# Patient Record
Sex: Female | Born: 1965
Health system: Southern US, Community
[De-identification: ages and names within clinical notes are randomized; demographics above are authoritative.]

## PROBLEM LIST (undated history)

## (undated) DIAGNOSIS — F419 Anxiety disorder, unspecified: Secondary | ICD-10-CM

## (undated) DIAGNOSIS — M1990 Unspecified osteoarthritis, unspecified site: Secondary | ICD-10-CM

## (undated) DIAGNOSIS — N289 Disorder of kidney and ureter, unspecified: Secondary | ICD-10-CM

## (undated) DIAGNOSIS — F32A Depression, unspecified: Secondary | ICD-10-CM

## (undated) HISTORY — DX: Anxiety disorder, unspecified: F41.9

## (undated) HISTORY — PX: CHOLECYSTECTOMY: SHX55

## (undated) HISTORY — DX: Unspecified osteoarthritis, unspecified site: M19.90

## (undated) HISTORY — DX: Depression, unspecified: F32.A

## (undated) HISTORY — PX: JOINT REPLACEMENT: SHX530

## (undated) HISTORY — PX: BLADDER SURGERY: SHX569

---

## 1998-04-13 ENCOUNTER — Other Ambulatory Visit: Admission: RE | Admit: 1998-04-13 | Discharge: 1998-04-13 | Payer: Self-pay | Admitting: *Deleted

## 1998-10-18 ENCOUNTER — Other Ambulatory Visit: Admission: RE | Admit: 1998-10-18 | Discharge: 1998-10-18 | Payer: Self-pay | Admitting: Obstetrics and Gynecology

## 1999-04-24 ENCOUNTER — Other Ambulatory Visit: Admission: RE | Admit: 1999-04-24 | Discharge: 1999-04-24 | Payer: Self-pay | Admitting: *Deleted

## 2000-06-12 ENCOUNTER — Other Ambulatory Visit: Admission: RE | Admit: 2000-06-12 | Discharge: 2000-06-12 | Payer: Self-pay | Admitting: *Deleted

## 2001-06-12 ENCOUNTER — Other Ambulatory Visit: Admission: RE | Admit: 2001-06-12 | Discharge: 2001-06-12 | Payer: Self-pay | Admitting: Obstetrics and Gynecology

## 2002-09-01 ENCOUNTER — Other Ambulatory Visit: Admission: RE | Admit: 2002-09-01 | Discharge: 2002-09-01 | Payer: Self-pay | Admitting: Obstetrics and Gynecology

## 2003-12-16 ENCOUNTER — Other Ambulatory Visit: Admission: RE | Admit: 2003-12-16 | Discharge: 2003-12-16 | Payer: Self-pay | Admitting: Obstetrics and Gynecology

## 2006-09-08 ENCOUNTER — Inpatient Hospital Stay (HOSPITAL_COMMUNITY): Admission: EM | Admit: 2006-09-08 | Discharge: 2006-09-09 | Payer: Self-pay | Admitting: Emergency Medicine

## 2006-09-08 ENCOUNTER — Encounter (INDEPENDENT_AMBULATORY_CARE_PROVIDER_SITE_OTHER): Payer: Self-pay | Admitting: *Deleted

## 2009-12-18 ENCOUNTER — Encounter: Admission: RE | Admit: 2009-12-18 | Discharge: 2009-12-18 | Payer: Self-pay | Admitting: Gastroenterology

## 2011-04-19 NOTE — Op Note (Signed)
NAMEJHOANNA, Toni Hamilton            ACCOUNT NO.:  [id number]   MEDICAL RECORD NO.:  [id number]          PATIENT TYPE:  INP   LOCATION:  1503                         FACILITY:  [facility]   PHYSICIAN:  Lebron Conners, M.D.   DATE OF BIRTH:  03-16-1966   DATE OF PROCEDURE:  09/08/2006  DATE OF DISCHARGE:                                 OPERATIVE REPORT   PRE AND POSTOPERATIVE DIAGNOSIS:  Cholelithiasis and acute cholecystitis.   OPERATION:  Laparoscopic cholecystectomy.   SURGEON:  Lebron Conners, M.D.   ASSISTANT:  Currie Paris, M.D.   ANESTHESIA:  General and local   COMPLICATIONS:  None.   BLOOD LOSS:  Minimal.   SPECIMEN:  Gallbladder.   PROCEDURE:  After the patient was monitored and asleep and had routine  preparation and draping of the abdomen, I infiltrated local anesthetic just  below the umbilicus.  I made about a 2 cm transverse incision and did  deepened that through the subcutaneous tissues until I came to the midline  fascia which I incised for about 2 cm in the midline.  I then bluntly  entered the peritoneal cavity.  I placed a 0 Vicryl pursestring suture in  the fascia and secured a Hassan cannula and inflated the abdomen with carbon  dioxide.  I then viewed the abdominal contents and saw no abnormalities  except for acute inflammation in the right upper quadrant with very  edematous and enlarged gallbladder.  I put in three additional ports through  the anesthetized sites under direct view assuring that there was no injury  of the viscera.  I then decompressed the gallbladder with a suction  aspirator.  I grasped fundus with a grasper and elevated it toward the right  shoulder.  With the patient positioned head-up foot down and tilted toward  the left, I took down the adhesions and then grasped the infundibulum of the  gallbladder and pulled it laterally.  The whole area was very edematous but  I was able to identify the cystic duct emerging from the  infundibulum and  coursing medially toward the common bile duct.  I clipped it with three  clips cut between the two which were closest to the gallbladder.  I then  identified two branches of the cystic artery and similarly clipped and  divided them.  I found one or two other small similar arterial structures  and clipped them as well.  I dissected the gallbladder from the liver  utilizing the cautery and gaining hemostasis with cautery.  I copiously  irrigated the operative area and removed the irrigant and then removed the  gallbladder from the body through the umbilical incision in a plastic pouch.  I then reinserted the cannula and camera and again irrigated the operative  area and assured good hemostasis.  I then removed the two lateral ports under direct view, then removed the  other ports.  After allowing carbon dioxide to escape, I closed the midline  fascia with pursestring suture and with an additional 0 Vicryl suture.  I  closed all skin incisions with intracuticular 4-0 Vicryl and Steri-Strips.  The  patient was stable through the procedure.      Lebron Conners, M.D.  Electronically Signed     WB/MEDQ  D:  09/08/2006  T:  09/10/2006  Job:  [id number]

## 2011-04-19 NOTE — H&P (Signed)
Toni Hamilton, Toni Hamilton            ACCOUNT NO.:  [id number]   MEDICAL RECORD NO.:  [id number]          PATIENT TYPE:  EMS   LOCATION:  ED                           [facility]ACILITY:  [facility]   PHYSICIAN:  Adolph Pollack, M.D.DATE OF BIRTH:  1965/12/14   DATE OF ADMISSION:  09/07/2006  DATE OF DISCHARGE:                                HISTORY & PHYSICAL   CHIEF COMPLAINT:  Right upper quadrant pain.   HISTORY OF PRESENT ILLNESS:  This 45 year old female awoke, yesterday  morning, at 4 o'clock with the abrupt onset of right upper quadrant type  pain radiating up to the chest region.  It was associated nausea.  The pain  was unrelenting; and thus, she presented to the emergency department last  night; and was evaluated.  She was found to have an elevation of white blood  cell count.  A CT scan was performed and demonstrated findings consistent  with acute cholecystitis; and thus I was called to see her.  She states that  she has been having intermittent bouts of self-limited right upper quadrant  pain after meals for about the past month or so, but nothing this bad.   PAST MEDICAL HISTORY:  1. Childhood urinary tract infections.  2. Depression.  Previous operations bladder stretching.   ALLERGIES:  None.   MEDICATIONS:  1. Wellbutrin.  2. Oral contraceptive pill.   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  No tobacco or alcohol use.  Husband is here with her.   FAMILY HISTORY:  Positive for gallbladder disease in her brother.   REVIEW OF SYSTEMS:  CARDIOVASCULAR:  No heart disease or hypertension.  PULMONARY:  No asthma, pneumonia, chronic lung disease.  GI:  No peptic  ulcer disease, hiatal hernia, hepatitis, or colitis.  GU: No kidney stones.  Endocrine:  No diabetes, thyroid disease, or hypercholesterolemia.  NEUROLOGIC:  No seizures.  Hematologic:  No known bleeding disorders, blood  clots or blood transfusions.   PHYSICAL EXAM:  GENERAL:   A slightly ill-appearing female, very pleasant and  cooperative.  VITAL SIGNS:  Temperature is 100 degrees, blood pressure is 119/80, pulse  82.  EYES:  Extraocular motions intact.  No icterus.  NECK:  Supple without masses or obvious thyroid enlargement.  RESPIRATORY:  The breath sounds are equal and clear.  Respirations  unlabored.  CARDIOVASCULAR:  Demonstrates a regular rate, regular rhythm, no murmur  heard.  No JVD.  ABDOMEN:  Soft.  There is right upper quadrant tenderness and guarding and a  positive Murphy's sign.  Hypoactive bowel sounds are noted.  MUSCULOSKELETAL:  She has good muscle tone.  SKIN:  No jaundice.  NEUROLOGIC:  Alert and oriented, and answers questions appropriately.   LABORATORY DATA:  Remarkable for white blood cell count 19,500.  UA  negative.  Urine pregnancy negative.  Liver function test normal.   CT SCAN:  Demonstrates cholelithiasis, gallbladder wall thickening, and  pericholecystic fluid.   IMPRESSION:  Acute cholecystitis.   PLAN:  Admit to the hospital.  Start IV antibiotics.  Laparoscopic possible  open cholecystectomy.  I  have explained the procedure and the risks to her  and her husband.  The risks include but are not limited to bleeding,  infection, wound healing problems, anesthesia, accidental injury of the  common bile duct/liver/intestine, and bile leak.  They seem to understand  this and agree with the plan.      Adolph Pollack, M.D.  Electronically Signed     TJR/MEDQ  D:  09/08/2006  T:  09/08/2006  Job:  [id number]   cc:   Molly Maduro L. Foy [place], M.D.  Fax: [phone]

## 2014-08-30 ENCOUNTER — Other Ambulatory Visit: Payer: Self-pay | Admitting: Family Medicine

## 2014-08-30 DIAGNOSIS — N183 Chronic kidney disease, stage 3 unspecified: Secondary | ICD-10-CM

## 2014-09-02 ENCOUNTER — Ambulatory Visit
Admission: RE | Admit: 2014-09-02 | Discharge: 2014-09-02 | Disposition: A | Discharge status: Home / Self Care | Payer: BC Managed Care – PPO | Source: Ambulatory Visit | Attending: Family Medicine | Admitting: Family Medicine

## 2014-09-02 DIAGNOSIS — N183 Chronic kidney disease, stage 3 unspecified: Secondary | ICD-10-CM

## 2016-04-30 ENCOUNTER — Emergency Department (HOSPITAL_COMMUNITY)
Admission: EM | Admit: 2016-04-30 | Discharge: 2016-04-30 | Disposition: A | Discharge status: Home / Self Care | Payer: BC Managed Care – PPO | Attending: Emergency Medicine | Admitting: Emergency Medicine

## 2016-04-30 ENCOUNTER — Emergency Department (HOSPITAL_COMMUNITY): Payer: BC Managed Care – PPO

## 2016-04-30 ENCOUNTER — Encounter (HOSPITAL_COMMUNITY): Payer: Self-pay | Admitting: *Deleted

## 2016-04-30 DIAGNOSIS — R000 Tachycardia, unspecified: Secondary | ICD-10-CM | POA: Diagnosis not present

## 2016-04-30 DIAGNOSIS — R109 Unspecified abdominal pain: Secondary | ICD-10-CM | POA: Diagnosis present

## 2016-04-30 DIAGNOSIS — N183 Chronic kidney disease, stage 3 (moderate): Secondary | ICD-10-CM | POA: Diagnosis not present

## 2016-04-30 DIAGNOSIS — Z3202 Encounter for pregnancy test, result negative: Secondary | ICD-10-CM | POA: Insufficient documentation

## 2016-04-30 DIAGNOSIS — N3000 Acute cystitis without hematuria: Secondary | ICD-10-CM | POA: Insufficient documentation

## 2016-04-30 DIAGNOSIS — N3090 Cystitis, unspecified without hematuria: Secondary | ICD-10-CM

## 2016-04-30 HISTORY — DX: Disorder of kidney and ureter, unspecified: N28.9

## 2016-04-30 LAB — COMPREHENSIVE METABOLIC PANEL
ALBUMIN: 4 g/dL (ref 3.5–5.0)
ALK PHOS: 75 U/L (ref 38–126)
ALT: 27 U/L (ref 14–54)
ANION GAP: 9 (ref 5–15)
AST: 24 U/L (ref 15–41)
BILIRUBIN TOTAL: 0.9 mg/dL (ref 0.3–1.2)
BUN: 9 mg/dL (ref 6–20)
CALCIUM: 9.8 mg/dL (ref 8.9–10.3)
CO2: 22 mmol/L (ref 22–32)
Chloride: 103 mmol/L (ref 101–111)
Creatinine, Ser: 1.01 mg/dL — ABNORMAL HIGH (ref 0.44–1.00)
Glucose, Bld: 101 mg/dL — ABNORMAL HIGH (ref 65–99)
POTASSIUM: 4.2 mmol/L (ref 3.5–5.1)
Sodium: 134 mmol/L — ABNORMAL LOW (ref 135–145)
TOTAL PROTEIN: 7.2 g/dL (ref 6.5–8.1)

## 2016-04-30 LAB — POC URINE PREG, ED: Preg Test, Ur: NEGATIVE

## 2016-04-30 LAB — URINALYSIS, ROUTINE W REFLEX MICROSCOPIC
Bilirubin Urine: NEGATIVE
GLUCOSE, UA: NEGATIVE mg/dL
HGB URINE DIPSTICK: NEGATIVE
KETONES UR: NEGATIVE mg/dL
Nitrite: POSITIVE — AB
PROTEIN: NEGATIVE mg/dL
Specific Gravity, Urine: 1.024 (ref 1.005–1.030)
pH: 7 (ref 5.0–8.0)

## 2016-04-30 LAB — URINE MICROSCOPIC-ADD ON

## 2016-04-30 LAB — CBC
HEMATOCRIT: 43.6 % (ref 36.0–46.0)
HEMOGLOBIN: 14.5 g/dL (ref 12.0–15.0)
MCH: 31.7 pg (ref 26.0–34.0)
MCHC: 33.3 g/dL (ref 30.0–36.0)
MCV: 95.4 fL (ref 78.0–100.0)
PLATELETS: 485 10*3/uL — AB (ref 150–400)
RBC: 4.57 MIL/uL (ref 3.87–5.11)
RDW: 12.9 % (ref 11.5–15.5)
WBC: 20.5 10*3/uL — AB (ref 4.0–10.5)

## 2016-04-30 LAB — LIPASE, BLOOD: Lipase: 19 U/L (ref 11–51)

## 2016-04-30 MED ORDER — ONDANSETRON HCL 4 MG/2ML IJ SOLN
4.0000 mg | Freq: Once | INTRAMUSCULAR | Status: AC
  Administered 2016-04-30: 4 mg via INTRAVENOUS
  Filled 2016-04-30: qty 2

## 2016-04-30 MED ORDER — FENTANYL CITRATE (PF) 100 MCG/2ML IJ SOLN
50.0000 ug | Freq: Once | INTRAMUSCULAR | Status: AC
  Administered 2016-04-30: 50 ug via INTRAVENOUS
  Filled 2016-04-30: qty 2

## 2016-04-30 MED ORDER — IOPAMIDOL (ISOVUE-300) INJECTION 61%
INTRAVENOUS | Status: AC
  Administered 2016-04-30: 100 mL
  Filled 2016-04-30: qty 100

## 2016-04-30 MED ORDER — CEPHALEXIN 500 MG PO CAPS: 500.0000 mg | ORAL_CAPSULE | Freq: Four times a day (QID) | ORAL | Status: DC

## 2016-04-30 MED ORDER — ONDANSETRON 4 MG PO TBDP: 4.0000 mg | ORAL_TABLET | Freq: Three times a day (TID) | ORAL | Status: DC | PRN

## 2016-04-30 MED ORDER — SODIUM CHLORIDE 0.9 % IV BOLUS (SEPSIS)
1000.0000 mL | Freq: Once | INTRAVENOUS | Status: AC
  Administered 2016-04-30: 1000 mL via INTRAVENOUS

## 2016-04-30 MED ORDER — DEXTROSE 5 % IV SOLN
1.0000 g | Freq: Once | INTRAVENOUS | Status: AC
  Administered 2016-04-30: 1 g via INTRAVENOUS
  Filled 2016-04-30: qty 10

## 2016-04-30 NOTE — ED Notes (Signed)
Pt in from [place] for r/o appendicitis, pt c/o nausea with bil lower abd pain onset yesterday, pt reports x 2 liquid stools in 24 hrs, pt denies vomiting, A&O x4

## 2016-04-30 NOTE — ED Provider Notes (Signed)
CSN: [id number]     Arrival date & time 04/30/16  1251 History   First MD Initiated Contact with Patient 04/30/16 1348     Chief Complaint  Patient presents with  . Abdominal Pain     HPI   Toni Hamilton is an 50 y.o. female with history of CKD who is sent to the ED from [facility] for evaluation of abdominal pain concerning for appendicitis. Pt states she was in her usual state of health until last night when she started having sudden onset periumbilical pain that now moved her lower abdomen, R > L. Pain is worse with movement. She reports associated nausea but denies emesis. She reports two episodes of watery diarrhea since last night. Denies urinary symptoms. She denies fever though said temp at PCP was 99.47F. States she hasn't eaten or drank anything since 7 PM last night. She reports history of cholecystectomy but denies other abdominal surgeries. She has not tried anything else to alleviate her pain.  Past Medical History  Diagnosis Date  . Kidney disease     Stage 3   Past Surgical History  Procedure Laterality Date  . Cholecystectomy    . Bladder surgery     No family history on file. Social History  Substance Use Topics  . Smoking status: Never Smoker   . Smokeless tobacco: None  . Alcohol Use: Yes     Comment: occas   OB History    No data available     Review of Systems  All other systems reviewed and are negative.     Allergies  Review of patient's allergies indicates no known allergies.  Home Medications   Prior to Admission medications   Not on File   BP 119/74 mmHg  Pulse 107  Temp(Src) 98.5 F (36.9 C) (Oral)  Resp 18  Wt 82.555 kg  SpO2 94% Physical Exam  Constitutional: She is oriented to person, place, and time.  Appears uncomfortable  HENT:  Right Ear: External ear normal.  Left Ear: External ear normal.  Nose: Nose normal.  Mouth/Throat: Oropharynx is clear and moist. No oropharyngeal exudate.  Eyes: Conjunctivae and EOM are normal.  Pupils are equal, round, and reactive to light.  Neck: Normal range of motion. Neck supple.  Cardiovascular: Regular rhythm, normal heart sounds and intact distal pulses.  Tachycardia present.   Pulmonary/Chest: Effort normal and breath sounds normal. No respiratory distress. She has no wheezes. She exhibits no tenderness.  Abdominal: Soft. Bowel sounds are normal. She exhibits no distension. There is tenderness. There is no rebound and no guarding.  Diffuse lower abdominal tenderness but RLQ is the most tender. Negative rebound no guarding. Abdomen is soft. Negative Rovsing's.  Mild bilateral CVA ttp  Musculoskeletal: She exhibits no edema.  Neurological: She is alert and oriented to person, place, and time. No cranial nerve deficit.  Skin: Skin is warm and dry.  Psychiatric: She has a normal mood and affect.  Nursing note and vitals reviewed.   ED Course  Procedures (including critical care time) Labs Review Labs Reviewed  COMPREHENSIVE METABOLIC PANEL - Abnormal; Notable for the following:    Sodium 134 (*)    Glucose, Bld 101 (*)    Creatinine, Ser 1.01 (*)    All other components within normal limits  CBC - Abnormal; Notable for the following:    WBC 20.5 (*)    Platelets 485 (*)    All other components within normal limits  URINALYSIS, ROUTINE W REFLEX MICROSCOPIC (  NOT AT [facility]) - Abnormal; Notable for the following:    APPearance CLOUDY (*)    Nitrite POSITIVE (*)    Leukocytes, UA SMALL (*)    All other components within normal limits  URINE MICROSCOPIC-ADD ON - Abnormal; Notable for the following:    Squamous Epithelial / LPF 0-5 (*)    Bacteria, UA MANY (*)    All other components within normal limits  URINE CULTURE  LIPASE, BLOOD  POC URINE PREG, ED    Imaging Review Ct Abdomen Pelvis W Contrast  04/30/2016  CLINICAL DATA:  Right lower quadrant pain with nausea, vomiting, and diarrhea. EXAM: CT ABDOMEN AND PELVIS WITH CONTRAST TECHNIQUE: Multidetector CT imaging  of the abdomen and pelvis was performed using the standard protocol following bolus administration of intravenous contrast. CONTRAST:  115mL ISOVUE-300 IOPAMIDOL (ISOVUE-300) INJECTION 61% COMPARISON:  12/18/2009 FINDINGS: Lower chest and abdominal wall:  Minor dependent atelectasis. Hepatobiliary: Hepatic steatosis with patchy fat in the right liver.Cholecystectomy with normal common bile duct appearance Pancreas: Unremarkable. Spleen: Negative Adrenals/Urinary Tract: Negative adrenals. Patchy scarring in the right more than left kidney with polar predominance. Mild bladder wall thickening with pericystic fat inflammation. Reproductive:IUD with transverse segment somewhat rotated in the endometrial cavity, but no suspected myometrial perforation. Simple appearing cystic structure in the right ovary measuring 23 mm, below size threshold for imaging follow-up per consensus criteria. Stomach/Bowel: No convincing appendicitis. The proximal appendix is prominent in thickness, especially on axial images, but measures at 7 mm on reformats. No convincing fat inflammation and the tip is completely collapsed and does not appear inflamed. No bowel obstruction. Vascular/Lymphatic: No acute vascular abnormality. No mass or adenopathy. Peritoneal: No ascites or pneumoperitoneum. Musculoskeletal: No acute abnormalities. IMPRESSION: 1. Suspect acute cystitis. Renal cortical scarring, greater on the right, without evidence of acute pyelonephritis. 2. No convincing appendicitis. 3. Hepatic steatosis. Electronically Signed   By: Monte Fantasia M.D.   On: 04/30/2016 15:47   I have personally reviewed and evaluated these images and lab results as part of my medical decision-making.   EKG Interpretation None      MDM   Final diagnoses:  Cystitis    Labs reveal a leukocytosis of 20.5, mild hyponatremia of 134. UA is nitrite positive. Pt denies urinary symptoms. However, given her abdominal pain and white count I will send  for culture and give a dose of rocephin. Pain is improved in the ED after a dose of fentanyl.  CT is pending. Pain continues to be well controlled.   CT reveals acute cystitis. No e/o appendicitis or pyelo. Will finish rocephin here and send home with rx for keflex. Pt feels much improved and is hemodynamically stable. Instructed close pcp follow up. STrict ER return precautions given.  Anne Ng, PA-C 05/01/16 [place], DO 05/01/16 1400

## 2016-04-30 NOTE — ED Notes (Signed)
Pt ambulates independently and with steady gait at time of discharge. Discharge instructions and follow up information reviewed with patient. No other questions or concerns voiced at this time.  

## 2016-04-30 NOTE — Discharge Instructions (Signed)

## 2016-05-03 LAB — URINE CULTURE: Culture: >=100000 — AB

## 2016-05-04 ENCOUNTER — Telehealth (HOSPITAL_BASED_OUTPATIENT_CLINIC_OR_DEPARTMENT_OTHER): Payer: Self-pay

## 2016-05-04 NOTE — Telephone Encounter (Signed)
Post ED Visit - Positive Culture Follow-up  Culture report reviewed by antimicrobial stewardship pharmacist:  []  Elenor Quinones, Pharm.D. []  Heide Guile, Pharm.D., BCPS []  Parks Neptune, Pharm.D. []  Alycia Rossetti, Pharm.D., BCPS []  [place], Pharm.D., BCPS, AAHIVP [x]  Legrand Como, Pharm.D., BCPS, AAHIVP []  Milus Glazier, Pharm.D. []  Stephens November, Pharm.D.  Positive urine culture Treated with Cephalexin, organism sensitive to the same and no further patient follow-up is required at this time.  Genia Del 05/04/2016, 9:56 AM

## 2017-05-21 IMAGING — CT CT ABD-PELV W/ CM
2 of 5 series · 16 of 46 positions shown, 18 images · IV contrast (Omni 300)
Comparison: 12/18/2009

CLINICAL DATA: Right lower quadrant pain with nausea, vomiting, and
diarrhea.

EXAM:
CT ABDOMEN AND PELVIS WITH CONTRAST
TECHNIQUE: Multidetector CT imaging of the abdomen and pelvis was performed
using the standard protocol following bolus administration of
intravenous contrast.
CONTRAST:  100mL 0UMELK-UUU IOPAMIDOL (0UMELK-UUU) INJECTION 61%

[Series 2: a/p w/ 5mm · axial · 0.72mm/px · z∈[-1211,-791]mm · 13 of 96 slices shown, 15 images]
[im 6/96  soft-tissue]
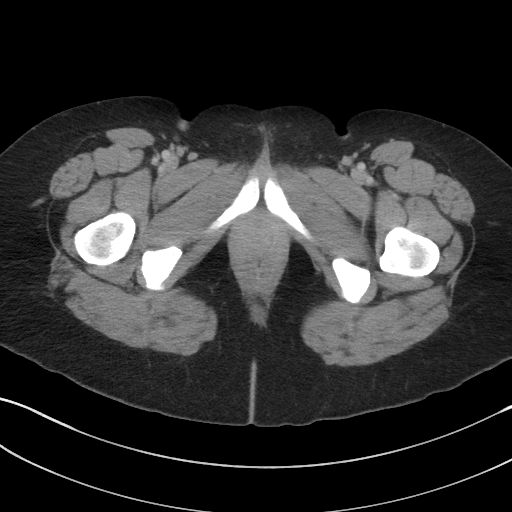
[im 6/96  bone]
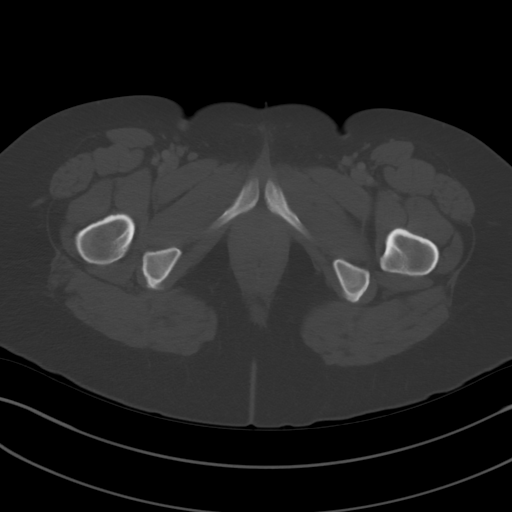
[im 11/96  soft-tissue]
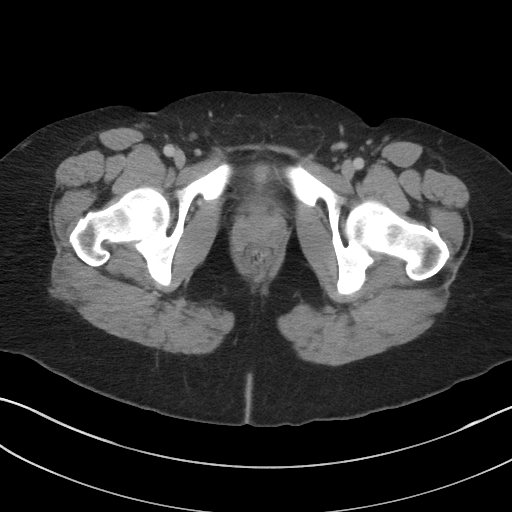
[im 22/96  soft-tissue]
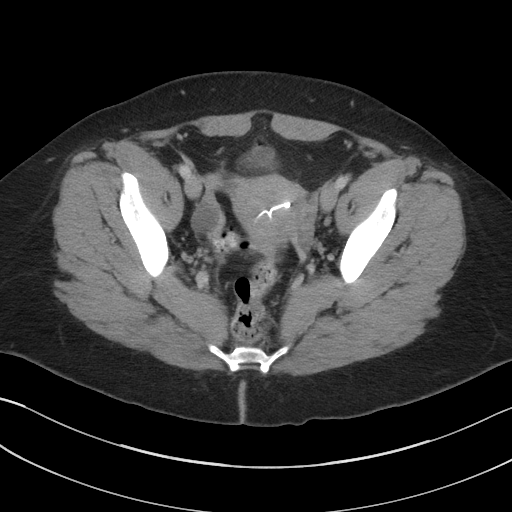
[im 27/96  soft-tissue]
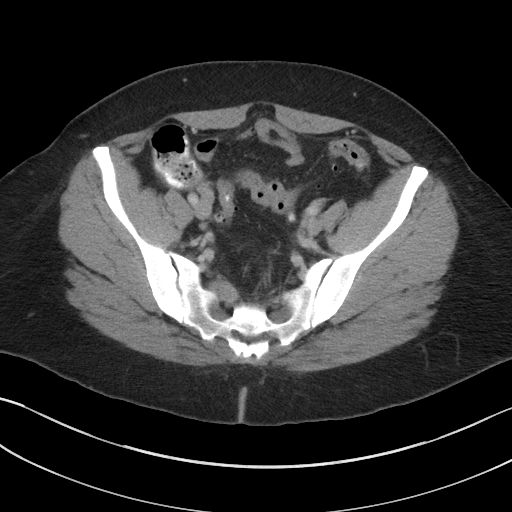
[im 32/96  soft-tissue]
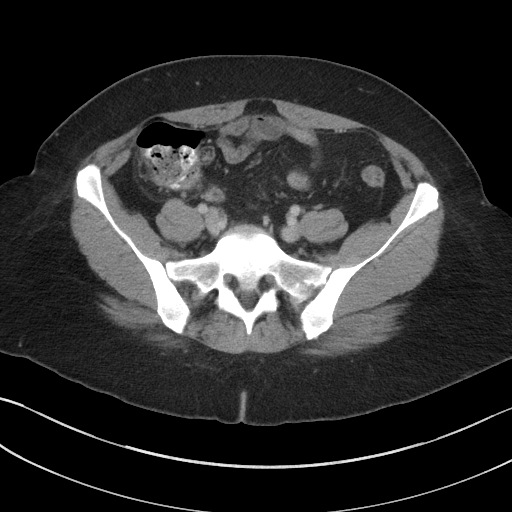
[im 43/96  soft-tissue]
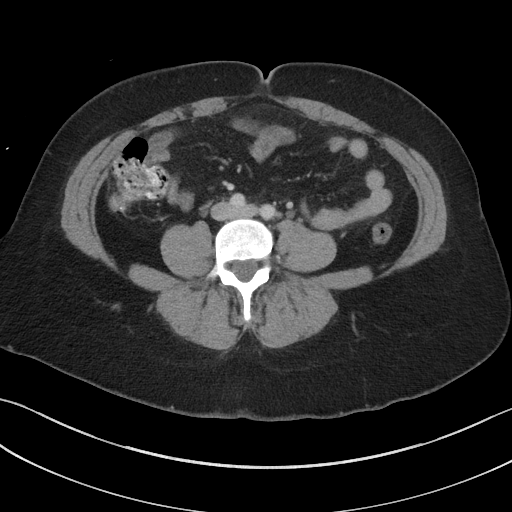
[im 48/96  soft-tissue]
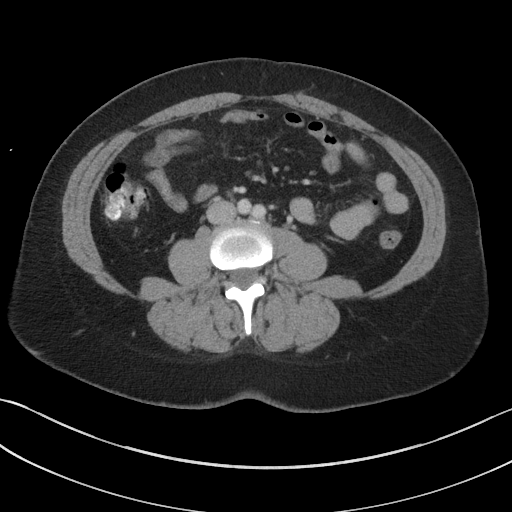
[im 53/96  soft-tissue]
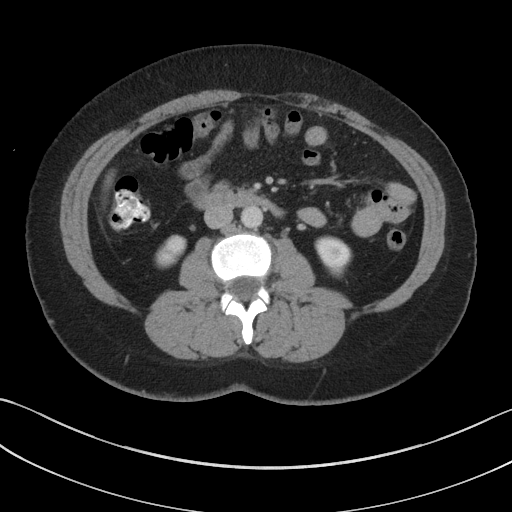
[im 64/96  soft-tissue]
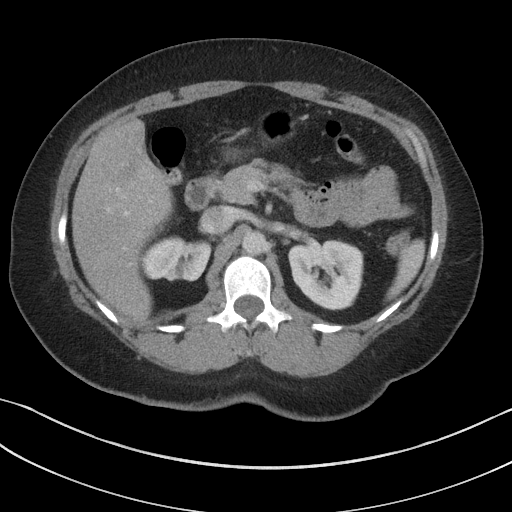
[im 64/96  bone]
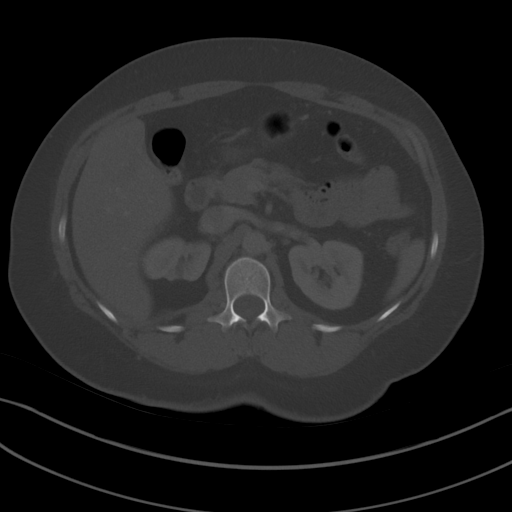
[im 69/96  soft-tissue]
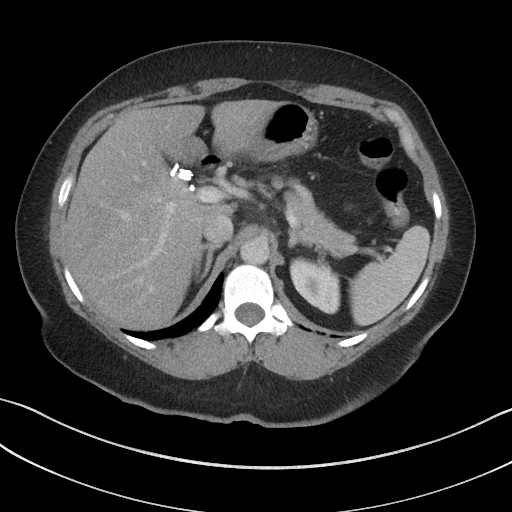
[im 74/96  soft-tissue]
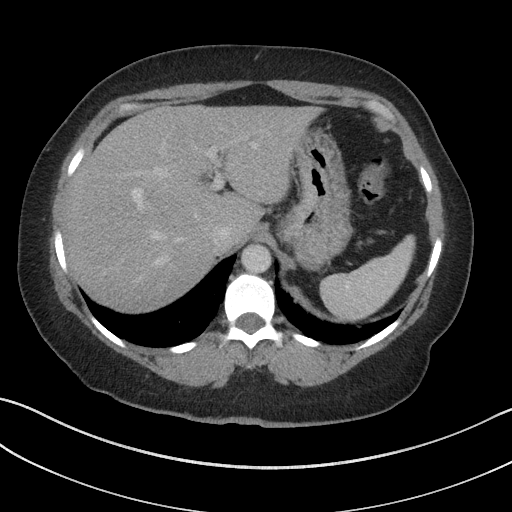
[im 85/96  soft-tissue]
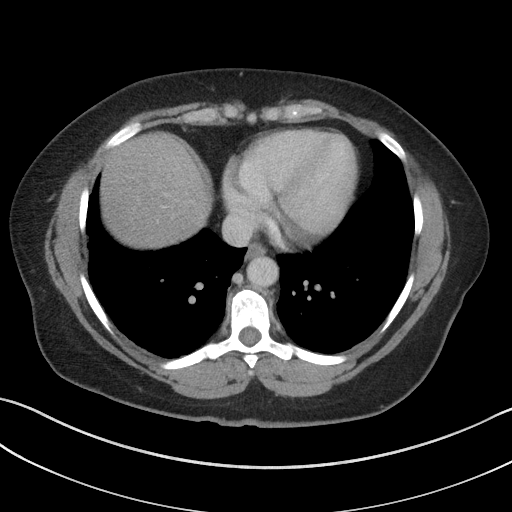
[im 90/96  soft-tissue]
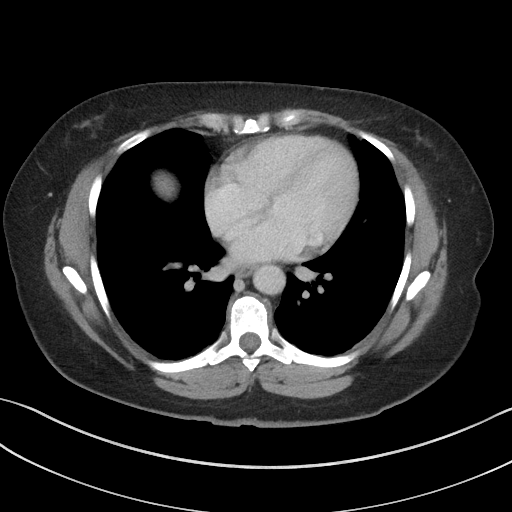

[Series 5: a/p w/ cor · coronal · 0.89mm/px · 3 of 110 slices shown]
[im 37/110  soft-tissue]
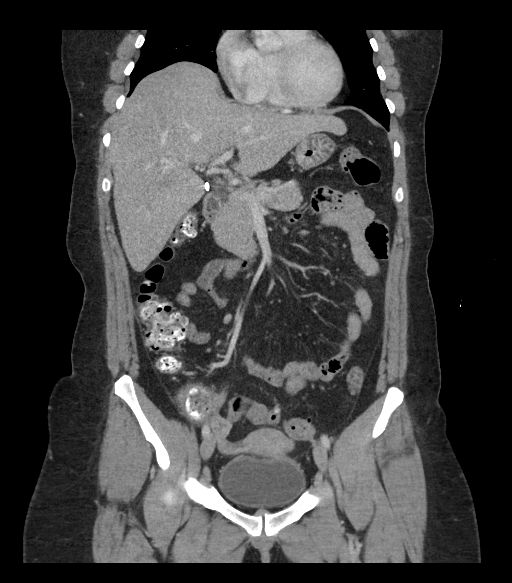
[im 49/110  soft-tissue]
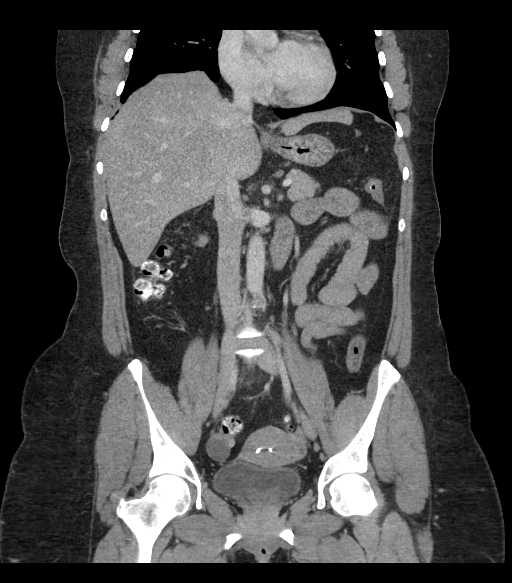
[im 61/110  soft-tissue]
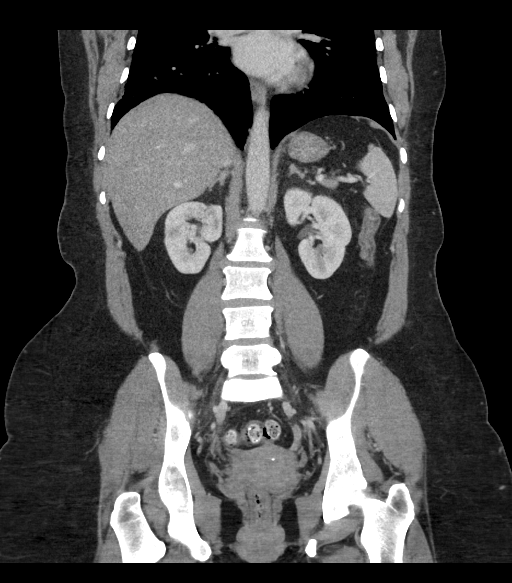

[16 of 46 positions shown; findings below may reference images not displayed]

FINDINGS: Lower chest and abdominal wall:  Minor dependent atelectasis.

Hepatobiliary: Hepatic steatosis with patchy fat in the right
liver.Cholecystectomy with normal common bile duct appearance

Pancreas: Unremarkable.

Spleen: Negative

Adrenals/Urinary Tract: Negative adrenals. Patchy scarring in the
right more than left kidney with polar predominance. Mild bladder
wall thickening with pericystic fat inflammation.

Reproductive:IUD with transverse segment somewhat rotated in the
endometrial cavity, but no suspected myometrial perforation. Simple
appearing cystic structure in the right ovary measuring 23 mm, below
size threshold for imaging follow-up per consensus criteria.

Stomach/Bowel: No convincing appendicitis. The proximal appendix is
prominent in thickness, especially on axial images, but measures at
7 mm on reformats. No convincing fat inflammation and the tip is
completely collapsed and does not appear inflamed. No bowel
obstruction.

Vascular/Lymphatic: No acute vascular abnormality. No mass or
adenopathy.

Peritoneal: No ascites or pneumoperitoneum.

Musculoskeletal: No acute abnormalities.
IMPRESSION: 1. Suspect acute cystitis. Renal cortical scarring, greater on the
right, without evidence of acute pyelonephritis.
2. No convincing appendicitis.
3. Hepatic steatosis.

## 2018-03-10 ENCOUNTER — Inpatient Hospital Stay: Payer: BC Managed Care – PPO | Attending: Hematology & Oncology | Admitting: Hematology & Oncology

## 2018-03-10 ENCOUNTER — Other Ambulatory Visit: Payer: Self-pay

## 2018-03-10 ENCOUNTER — Inpatient Hospital Stay: Payer: BC Managed Care – PPO

## 2018-03-10 DIAGNOSIS — Z79899 Other long term (current) drug therapy: Secondary | ICD-10-CM | POA: Diagnosis not present

## 2018-03-10 DIAGNOSIS — D473 Essential (hemorrhagic) thrombocythemia: Secondary | ICD-10-CM

## 2018-03-10 DIAGNOSIS — Z807 Family history of other malignant neoplasms of lymphoid, hematopoietic and related tissues: Secondary | ICD-10-CM | POA: Diagnosis not present

## 2018-03-10 DIAGNOSIS — N183 Chronic kidney disease, stage 3 (moderate): Secondary | ICD-10-CM | POA: Insufficient documentation

## 2018-03-10 DIAGNOSIS — Z806 Family history of leukemia: Secondary | ICD-10-CM | POA: Insufficient documentation

## 2018-03-10 LAB — CBC WITH DIFFERENTIAL (CANCER CENTER ONLY)
BASOS PCT: 0 %
Basophils Absolute: 0 10*3/uL (ref 0.0–0.1)
EOS ABS: 0.5 10*3/uL (ref 0.0–0.5)
Eosinophils Relative: 6 %
HCT: 40.4 % (ref 34.8–46.6)
HEMOGLOBIN: 13.6 g/dL (ref 11.6–15.9)
Lymphocytes Relative: 30 %
Lymphs Abs: 2.5 10*3/uL (ref 0.9–3.3)
MCH: 33.5 pg (ref 26.0–34.0)
MCHC: 33.7 g/dL (ref 32.0–36.0)
MCV: 99.5 fL (ref 81.0–101.0)
Monocytes Absolute: 0.8 10*3/uL (ref 0.1–0.9)
Monocytes Relative: 9 %
NEUTROS PCT: 55 %
Neutro Abs: 4.6 10*3/uL (ref 1.5–6.5)
Platelet Count: 477 10*3/uL — ABNORMAL HIGH (ref 145–400)
RBC: 4.06 MIL/uL (ref 3.70–5.32)
RDW: 12.8 % (ref 11.1–15.7)
WBC: 8.4 10*3/uL (ref 3.9–10.0)

## 2018-03-10 LAB — SAVE SMEAR

## 2018-03-10 LAB — CMP (CANCER CENTER ONLY)
ALK PHOS: 76 U/L (ref 26–84)
ALT: 16 U/L (ref 10–47)
ANION GAP: 5 (ref 5–15)
AST: 19 U/L (ref 11–38)
Albumin: 3.7 g/dL (ref 3.5–5.0)
BILIRUBIN TOTAL: 0.5 mg/dL (ref 0.2–1.6)
BUN: 13 mg/dL (ref 7–22)
CO2: 29 mmol/L (ref 18–33)
Calcium: 9.6 mg/dL (ref 8.0–10.3)
Chloride: 106 mmol/L (ref 98–108)
Creatinine: 1.1 mg/dL (ref 0.60–1.20)
Glucose, Bld: 84 mg/dL (ref 73–118)
Potassium: 3.9 mmol/L (ref 3.3–4.7)
Sodium: 140 mmol/L (ref 128–145)
Total Protein: 7.6 g/dL (ref 6.4–8.1)

## 2018-03-10 NOTE — Progress Notes (Signed)
Referral MD  Reason for Referral: Thrombocytosis  Chief Complaint  Patient presents with  . New Patient (Initial Visit)  : My platelets have been high.  HPI: Toni Hamilton is a very nice 52 year old white female.  She comes in with her husband.  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  She is followed by Dr. Kathyrn Lass.  Of note, she has chronic renal insufficiency.  She is seen by Dr. Florene Glen of [facility].  She has been found to have thrombocytosis.  She has had lab work in the past.  Back on 02/22/2018, she had a CBC which showed a white cell count of 9.  Hemoglobin 14.  Platelet count 630,000.  She had a normal white cell differential.  She had an MCV of 99.  At that time, her creatinine was 1.18.  She had a normal protein.  Her liver function tests were also normal.  She had iron studies done.  Her ferritin was 145.  She had lab work done on 02/25/2018.  At that time, her platelet count was 539,000.  Going back to May 2017, her platelet count was 539,000.  Her hemoglobin 14.5.  White cell count 20.3.  In January 2016, her platelet count was 536,000.  In September 2015, her platelet count was 579,000.  White cell count 12,000 and hemoglobin 14.2.  Her MCV was 97.5.  In March 2013, her platelet count was 444,000.  Her white cell count was 10.6 and hemoglobin 14.3.  She does not smoke.  She rarely has an alcoholic beverage.  She really has had no change in medications.  She has not had a kidney biopsy.  There is been no pain in her hands or feet.  She has had no redness in the hands or feet.  There is been no headache.  She has had some weight issues.  She does take phentermine for her weight.  There is been no family history of any kind of blood problem.  She said that there was a history of "leukemia" and Hodgkin's disease.  She is up-to-date with her mammograms.  There is been no nausea or vomiting.  She is had no change in bowel or bladder habits.   Patient had her gallbladder taken out several years ago.  This is for gallstones.  She still has her monthly cycles.  There is no leg swelling.  Overall, her performance status is ECOG 0.   Past Medical History:  Diagnosis Date  . Kidney disease    Stage 3  :  Past Surgical History:  Procedure Laterality Date  . BLADDER SURGERY    . CHOLECYSTECTOMY    :   Current Outpatient Medications:  .  acetaminophen (TYLENOL) 650 MG CR tablet, Take 1,300 mg by mouth daily as needed for pain., Disp: , Rfl:  .  buPROPion (WELLBUTRIN XL) 300 MG 24 hr tablet, Take 300 mg by mouth daily., Disp: , Rfl:  .  lisinopril (PRINIVIL,ZESTRIL) 10 MG tablet, Take 10 mg by mouth daily., Disp: , Rfl:  .  OVER THE COUNTER MEDICATION, Vitamin D2 1.25 mg one tablet weekly, Disp: , Rfl:  .  phentermine 37.5 MG capsule, Take 37.5 mg by mouth every morning., Disp: , Rfl: :  :  No Known Allergies:  No family history on file.:  Social History   Socioeconomic History  . Marital status: Single    Spouse name: Not on file  . Number of children: Not on file  . Years of  education: Not on file  . Highest education level: Not on file  Occupational History  . Not on file  Social Needs  . Financial resource strain: Not on file  . Food insecurity:    Worry: Not on file    Inability: Not on file  . Transportation needs:    Medical: Not on file    Non-medical: Not on file  Tobacco Use  . Smoking status: Never Smoker  Substance and Sexual Activity  . Alcohol use: Yes    Comment: occas  . Drug use: No  . Sexual activity: Not on file  Lifestyle  . Physical activity:    Days per week: Not on file    Minutes per session: Not on file  . Stress: Not on file  Relationships  . Social connections:    Talks on phone: Not on file    Gets together: Not on file    Attends religious service: Not on file    Active member of club or organization: Not on file    Attends meetings of clubs or organizations: Not on  file    Relationship status: Not on file  . Intimate partner violence:    Fear of current or ex partner: Not on file    Emotionally abused: Not on file    Physically abused: Not on file    Forced sexual activity: Not on file  Other Topics Concern  . Not on file  Social History Narrative  . Not on file  :  Review of Systems  Constitutional: Negative.   HENT: Negative.   Eyes: Negative.   Respiratory: Negative.   Cardiovascular: Negative.   Gastrointestinal: Negative.   Genitourinary: Negative.   Musculoskeletal: Negative.   Skin: Negative.   Neurological: Negative.   Endo/Heme/Allergies: Negative.   Psychiatric/Behavioral: Negative.      Exam: Well-developed and well-nourished white female in no obvious distress.  Vital signs show a temperature of 97.9.  Pulse 71.  Blood pressure 139/87.  Weight is 165 pounds.  Head neck exam shows no ocular or oral lesions.  There are no palpable cervical or supra clavicular lymph nodes.  She has no adenopathy in the neck.  There is no palpable thyroid.  Lungs are clear bilaterally.  Cardiac exam regular rate and rhythm with no murmurs, rubs or bruits.  Abdomen is soft.  She has good bowel sounds.  There is no fluid wave.  There is no palpable liver or spleen tip.  Back exam shows no tenderness over the spine ribs or hips.  Extremities shows no clubbing, cyanosis or edema.  She has good range of motion of her joints.  Neurological exam shows no focal neurological deficit.  Skin exam shows no rashes, ecchymoses or petechia. _0 @   Recent Labs    03/10/18 1504  WBC 8.4  HCT 40.4  PLT 477*   Recent Labs    03/10/18 1504  NA 140  K 3.9  CL 106  CO2 29  GLUCOSE 84  BUN 13  CREATININE 1.10  CALCIUM 9.6    Blood smear review: Normochromic and normocytic population of red blood cells.  There are no nucleated red blood cells.  I see no teardrop cells.  There are no inclusion bodies.  I see no rouleaux formation.  There are no  schistocytes.  White cells are normal in morphology and maturation.  There is no immature myeloid or lymphoid cells.  There are no hypersegmented polys.  Platelets are slightly increased in number.  I see very few large platelets.  Platelets are well granulated.  Pathology: None    Assessment and Plan: Ms. Goodchild is a very charming 52 year old white female.  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  What she does is incredibly important and totally unappreciated.  I am not sure if she has a myeloproliferative disorder.  Going through her records, she has had thrombocytosis for quite a while.  It has not trended upward.  We will send off a JAK 2 assay and a Calreticulin assay to see if she is positive.  I do not see anything on her physical exam that would suggest a myeloproliferative problem.  I cannot palpate her spleen.  I do not think we have to put her through any radiologic studies.  I do not see that we need to put her through a bone marrow biopsy right now.  I am sending off her blood for von Willebrand studies.  Sometimes, with myeloproliferative disease, there will be a bleeding tendency secondary to acquired von Willebrand disease.  I spent about an hour with she and her husband.  Over half the time was spent face-to-face.  I went over her lab work with her and her husband.  I went over my recommendations.  We will have to see what the JAK2 and Calreticulin assay show.  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  I recommend that I am sending off iron studies on her.  I really was not too impressed with the blood smear.  Typically, with essential thrombocythemia, you will see large platelets in the smear.  I will await the results of her workup before we figure out when to get her back.  I reassured her that I just did not think that she had a actual hematologic issue.

## 2018-03-11 LAB — VON WILLEBRAND PANEL
COAGULATION FACTOR VIII: 141 % (ref 57–163)
Ristocetin Co-factor, Plasma: 130 % (ref 50–200)
Von Willebrand Antigen, Plasma: 150 % (ref 50–200)

## 2018-03-11 LAB — FERRITIN: FERRITIN: 104 ng/mL (ref 9–269)

## 2018-03-11 LAB — LACTATE DEHYDROGENASE: LDH: 186 U/L (ref 125–245)

## 2018-03-11 LAB — IRON AND TIBC
IRON: 58 ug/dL (ref 41–142)
Saturation Ratios: 19 % — ABNORMAL LOW (ref 21–57)
TIBC: 309 ug/dL (ref 236–444)
UIBC: 251 ug/dL

## 2018-03-11 LAB — COAG STUDIES INTERP REPORT

## 2018-03-19 ENCOUNTER — Encounter: Payer: Self-pay | Admitting: Family

## 2018-03-25 ENCOUNTER — Other Ambulatory Visit: Payer: Self-pay | Admitting: Hematology & Oncology

## 2018-03-25 MED ORDER — INTEGRA PLUS PO CAPS: 1.0000 | ORAL_CAPSULE | Freq: Every day | ORAL | 6 refills | Status: DC

## 2018-03-25 NOTE — Addendum Note (Signed)
Addended by: Burney Gauze R on: 03/25/2018 03:50 PM   Modules accepted: Orders

## 2018-03-26 LAB — JAK2 (INCLUDING V617F AND EXON 12), MPL,& CALR-NEXT GEN SEQ

## 2018-03-31 ENCOUNTER — Encounter: Payer: Self-pay | Admitting: Hematology & Oncology

## 2018-04-24 ENCOUNTER — Inpatient Hospital Stay: Payer: BC Managed Care – PPO

## 2018-04-24 ENCOUNTER — Other Ambulatory Visit: Payer: Self-pay

## 2018-04-24 ENCOUNTER — Inpatient Hospital Stay: Payer: BC Managed Care – PPO | Attending: Hematology & Oncology | Admitting: Hematology & Oncology

## 2018-04-24 VITALS — BP 105/62 | HR 73 | Temp 97.6°F | Resp 18 | Wt 156.0 lb

## 2018-04-24 DIAGNOSIS — Z79899 Other long term (current) drug therapy: Secondary | ICD-10-CM | POA: Diagnosis not present

## 2018-04-24 DIAGNOSIS — D473 Essential (hemorrhagic) thrombocythemia: Secondary | ICD-10-CM

## 2018-04-24 LAB — CBC WITH DIFFERENTIAL (CANCER CENTER ONLY)
BASOS PCT: 0 %
Basophils Absolute: 0 10*3/uL (ref 0.0–0.1)
EOS ABS: 0.3 10*3/uL (ref 0.0–0.5)
Eosinophils Relative: 5 %
HCT: 42.2 % (ref 34.8–46.6)
HEMOGLOBIN: 14 g/dL (ref 11.6–15.9)
LYMPHS PCT: 32 %
Lymphs Abs: 2.2 10*3/uL (ref 0.9–3.3)
MCH: 33.1 pg (ref 26.0–34.0)
MCHC: 33.2 g/dL (ref 32.0–36.0)
MCV: 99.8 fL (ref 81.0–101.0)
Monocytes Absolute: 0.6 10*3/uL (ref 0.1–0.9)
Monocytes Relative: 8 %
Neutro Abs: 3.9 10*3/uL (ref 1.5–6.5)
Neutrophils Relative %: 55 %
Platelet Count: 523 10*3/uL — ABNORMAL HIGH (ref 145–400)
RBC: 4.23 MIL/uL (ref 3.70–5.32)
RDW: 12.4 % (ref 11.1–15.7)
WBC Count: 7.1 10*3/uL (ref 3.9–10.0)

## 2018-04-24 LAB — IRON AND TIBC
IRON: 108 ug/dL (ref 41–142)
Saturation Ratios: 36 % (ref 21–57)
TIBC: 300 ug/dL (ref 236–444)
UIBC: 192 ug/dL

## 2018-04-24 LAB — FERRITIN: FERRITIN: 172 ng/mL (ref 9–269)

## 2018-04-24 NOTE — Progress Notes (Signed)
Hematology and Oncology Follow Up Visit  Toni Hamilton [id number] [birth year] 52 y.o. 04/24/2018   Principle Diagnosis:   Thrombocytosis  Current Therapy:    Observation     Interim History:  Toni Hamilton is back for her second office visit.  We first saw her back in early April.  At that time, it was not clear at all whether there was any obvious hematologic issue.  I did put her on some oral iron.  She is doing okay with this.  We will see what her iron studies show.  I did send off a JAK2 and Calreticulin assay.  Both these were normal.  She is doing okay.  She is had no problems with bleeding or bruising.  She has had no pain.  She has had no burning in the hands or feet.  She is had no rashes.  She is had no change in bowel or bladder habits.  She has had no leg swelling.  She is had no cough or shortness of breath.  She is a principal at a elementary school.  She is incredibly busy at this time a year and will basically work all summer.  Overall, her performance status is ECOG 0.  Medications:  Current Outpatient Medications:  .  acetaminophen (TYLENOL) 650 MG CR tablet, Take 1,300 mg by mouth daily as needed for pain., Disp: , Rfl:  .  buPROPion (WELLBUTRIN XL) 300 MG 24 hr tablet, Take 300 mg by mouth daily., Disp: , Rfl:  .  FeFum-FePoly-FA-B Cmp-C-Biot (INTEGRA PLUS) CAPS, Take 1 capsule by mouth daily., Disp: 30 capsule, Rfl: 6 .  lisinopril (PRINIVIL,ZESTRIL) 10 MG tablet, Take 10 mg by mouth daily., Disp: , Rfl:  .  OVER THE COUNTER MEDICATION, Vitamin D2 1.25 mg one tablet weekly, Disp: , Rfl:  .  phentermine 37.5 MG capsule, Take 37.5 mg by mouth every morning., Disp: , Rfl:   Allergies: No Known Allergies  Past Medical History, Surgical history, Social history, and Family History were reviewed and updated.  Review of Systems: Review of Systems  Constitutional: Negative.   HENT:  Negative.   Eyes: Negative.   Respiratory: Negative.   Cardiovascular:  Negative.   Gastrointestinal: Negative.   Endocrine: Negative.   Genitourinary: Negative.    Musculoskeletal: Negative.   Skin: Negative.   Neurological: Negative.   Hematological: Negative.   Psychiatric/Behavioral: Negative.     Physical Exam:  weight is 156 lb (70.8 kg). Her oral temperature is 97.6 F (36.4 C). Her blood pressure is 105/62 and her pulse is 73. Her respiration is 18 and oxygen saturation is 100%.   Wt Readings from Last 3 Encounters:  04/24/18 156 lb (70.8 kg)  04/30/16 182 lb (82.6 kg)    Physical Exam  Constitutional: She is oriented to person, place, and time.  HENT:  Head: Normocephalic and atraumatic.  Mouth/Throat: Oropharynx is clear and moist.  Eyes: Pupils are equal, round, and reactive to light. EOM are normal.  Neck: Normal range of motion.  Cardiovascular: Normal rate, regular rhythm and normal heart sounds.  Pulmonary/Chest: Effort normal and breath sounds normal.  Abdominal: Soft. Bowel sounds are normal.  Musculoskeletal: Normal range of motion. She exhibits no edema, tenderness or deformity.  Lymphadenopathy:    She has no cervical adenopathy.  Neurological: She is alert and oriented to person, place, and time.  Skin: Skin is warm and dry. No rash noted. No erythema.  Psychiatric: She has a normal mood and affect. Her behavior  is normal. Judgment and thought content normal.  Vitals reviewed.    Lab Results  Component Value Date   WBC 7.1 04/24/2018   HGB 14.0 04/24/2018   HCT 42.2 04/24/2018   MCV 99.8 04/24/2018   PLT 523 (H) 04/24/2018     Chemistry      Component Value Date/Time   NA 140 03/10/2018 1504   K 3.9 03/10/2018 1504   CL 106 03/10/2018 1504   CO2 29 03/10/2018 1504   BUN 13 03/10/2018 1504   CREATININE 1.10 03/10/2018 1504      Component Value Date/Time   CALCIUM 9.6 03/10/2018 1504   ALKPHOS 76 03/10/2018 1504   AST 19 03/10/2018 1504   ALT 16 03/10/2018 1504   BILITOT 0.5 03/10/2018 1504        Impression and Plan: Toni Hamilton is a 52 year old white female with thrombocytosis.  This is mild thrombocytosis.  On first saw her, there is an element of iron deficiency.  As such, we put her on some oral iron.  Her platelet count is a little bit higher.  We will see what her iron levels are.  I do not see that we have to do a bone marrow test on her.  I do not see that I have to do any radiological studies.  I think we can just follow her along.  I will not put her on any aspirin right now.  We will plan to get her back in 3 months.  This will be close to when school starts back up for her.   Volanda Napoleon, MD 5/24/20198:53 AM

## 2018-04-28 ENCOUNTER — Telehealth: Payer: Self-pay | Admitting: *Deleted

## 2018-04-28 NOTE — Telephone Encounter (Addendum)
Patient is aware of results  ----- Message from Volanda Napoleon, MD sent at 04/24/2018  2:07 PM EDT ----- Call - the iron level is better.  Take 1 iron capsule a day!!  Laurey Arrow

## 2018-04-29 LAB — SOLUBLE TRANSFERRIN RECEPTOR: Transferrin Receptor: 8.3 nmol/L — ABNORMAL LOW (ref 12.2–27.3)

## 2018-04-29 MED ORDER — INTEGRA PLUS PO CAPS: 1.0000 | ORAL_CAPSULE | Freq: Every day | ORAL | 5 refills | Status: DC

## 2018-07-21 ENCOUNTER — Ambulatory Visit: Payer: BC Managed Care – PPO | Admitting: Hematology & Oncology

## 2018-07-21 ENCOUNTER — Other Ambulatory Visit: Payer: BC Managed Care – PPO

## 2020-01-29 ENCOUNTER — Ambulatory Visit: Payer: BC Managed Care – PPO | Attending: Internal Medicine

## 2020-01-29 DIAGNOSIS — Z23 Encounter for immunization: Secondary | ICD-10-CM | POA: Insufficient documentation

## 2020-01-29 NOTE — Progress Notes (Signed)
   Covid-19 Vaccination Clinic  Name:  LASHON HARTZEL    MRN: [id number] DOB: Feb 12, 1966  01/29/2020  Ms. Watanabe was observed post Covid-19 immunization for 15 minutes without incidence. She was provided with Vaccine Information Sheet and instruction to access the V-Safe system.   Ms. Folmsbee was instructed to call 911 with any severe reactions post vaccine: Marland Kitchen Difficulty breathing  . Swelling of your face and throat  . A fast heartbeat  . A bad rash all over your body  . Dizziness and weakness    Immunizations Administered    Name Date Dose VIS Date Route   Pfizer COVID-19 Vaccine 01/29/2020 12:01 PM 0.3 mL 11/12/2019 Intramuscular   Manufacturer: [place]   Lot: [id number]   [place]: [id number]

## 2020-02-23 ENCOUNTER — Ambulatory Visit: Payer: BC Managed Care – PPO | Attending: Internal Medicine

## 2020-02-23 DIAGNOSIS — Z23 Encounter for immunization: Secondary | ICD-10-CM

## 2020-02-23 NOTE — Progress Notes (Signed)
   Covid-19 Vaccination Clinic  Name:  Toni Hamilton    MRN: [id number] DOB: 1966/07/25  02/23/2020  Toni Hamilton was observed post Covid-19 immunization for 15 minutes without incident. She was provided with Vaccine Information Sheet and instruction to access the V-Safe system.   Toni Hamilton was instructed to call 911 with any severe reactions post vaccine: Marland Kitchen Difficulty breathing  . Swelling of face and throat  . A fast heartbeat  . A bad rash all over body  . Dizziness and weakness   Immunizations Administered    Name Date Dose VIS Date Route   Pfizer COVID-19 Vaccine 02/23/2020  2:21 PM 0.3 mL 11/12/2019 Intramuscular   Manufacturer: [place]   Lot: [id number]   [place]: [id number]

## 2020-07-20 ENCOUNTER — Other Ambulatory Visit: Payer: Self-pay

## 2022-10-29 ENCOUNTER — Other Ambulatory Visit (HOSPITAL_COMMUNITY): Payer: Self-pay

## 2022-10-29 MED ORDER — LISDEXAMFETAMINE DIMESYLATE 40 MG PO CAPS
40.0000 mg | ORAL_CAPSULE | Freq: Every day | ORAL | 0 refills | Status: DC
  Filled 2022-10-29: qty 30, 30d supply, fill #0

## 2022-10-30 ENCOUNTER — Other Ambulatory Visit (HOSPITAL_COMMUNITY): Payer: Self-pay

## 2022-11-01 ENCOUNTER — Other Ambulatory Visit (HOSPITAL_COMMUNITY): Payer: Self-pay

## 2022-11-05 ENCOUNTER — Other Ambulatory Visit (HOSPITAL_COMMUNITY): Payer: Self-pay

## 2022-11-05 MED ORDER — LISDEXAMFETAMINE DIMESYLATE 30 MG PO CAPS
30.0000 mg | ORAL_CAPSULE | Freq: Every day | ORAL | 0 refills | Status: DC
  Filled 2022-11-06: qty 30, 30d supply, fill #0

## 2022-11-06 ENCOUNTER — Other Ambulatory Visit (HOSPITAL_COMMUNITY): Payer: Self-pay

## 2022-11-07 ENCOUNTER — Other Ambulatory Visit (HOSPITAL_COMMUNITY): Payer: Self-pay

## 2022-11-11 ENCOUNTER — Other Ambulatory Visit (HOSPITAL_COMMUNITY): Payer: Self-pay

## 2022-11-11 MED ORDER — ZEPBOUND 5 MG/0.5ML ~~LOC~~ SOAJ
5.0000 mg | SUBCUTANEOUS | 0 refills | Status: DC
  Filled 2022-11-11 – 2022-11-13 (×2): qty 2, 28d supply, fill #0

## 2022-11-13 ENCOUNTER — Other Ambulatory Visit (HOSPITAL_COMMUNITY): Payer: Self-pay

## 2022-11-14 ENCOUNTER — Other Ambulatory Visit (HOSPITAL_COMMUNITY): Payer: Self-pay

## 2022-11-15 ENCOUNTER — Other Ambulatory Visit (HOSPITAL_COMMUNITY): Payer: Self-pay

## 2022-12-04 ENCOUNTER — Other Ambulatory Visit (HOSPITAL_COMMUNITY): Payer: Self-pay

## 2022-12-04 MED ORDER — ZEPBOUND 7.5 MG/0.5ML ~~LOC~~ SOAJ
7.5000 mg | SUBCUTANEOUS | 0 refills | Status: DC
  Filled 2022-12-04: qty 2, 28d supply, fill #0

## 2022-12-05 ENCOUNTER — Other Ambulatory Visit (HOSPITAL_COMMUNITY): Payer: Self-pay

## 2022-12-06 ENCOUNTER — Other Ambulatory Visit (HOSPITAL_COMMUNITY): Payer: Self-pay

## 2022-12-06 MED ORDER — LISDEXAMFETAMINE DIMESYLATE 40 MG PO CAPS
40.0000 mg | ORAL_CAPSULE | Freq: Every morning | ORAL | 0 refills | Status: DC
  Filled 2022-12-06: qty 30, 30d supply, fill #0

## 2022-12-06 MED ORDER — LISDEXAMFETAMINE DIMESYLATE 30 MG PO CAPS
30.0000 mg | ORAL_CAPSULE | Freq: Every morning | ORAL | 0 refills | Status: DC
  Filled 2022-12-06: qty 30, 30d supply, fill #0

## 2022-12-09 ENCOUNTER — Other Ambulatory Visit (HOSPITAL_COMMUNITY): Payer: Self-pay

## 2022-12-16 ENCOUNTER — Other Ambulatory Visit (HOSPITAL_COMMUNITY): Payer: Self-pay

## 2022-12-24 ENCOUNTER — Other Ambulatory Visit (HOSPITAL_COMMUNITY): Payer: Self-pay

## 2022-12-24 MED ORDER — ZEPBOUND 5 MG/0.5ML ~~LOC~~ SOAJ
5.0000 mg | SUBCUTANEOUS | 0 refills | Status: DC
  Filled 2022-12-24: qty 2, 28d supply, fill #0

## 2022-12-25 ENCOUNTER — Other Ambulatory Visit (HOSPITAL_COMMUNITY): Payer: Self-pay

## 2022-12-25 MED ORDER — LISDEXAMFETAMINE DIMESYLATE 40 MG PO CAPS
40.0000 mg | ORAL_CAPSULE | Freq: Every day | ORAL | 0 refills | Status: DC
  Filled 2022-12-25: qty 30, 30d supply, fill #0

## 2022-12-25 MED ORDER — LISDEXAMFETAMINE DIMESYLATE 30 MG PO CAPS
ORAL_CAPSULE | ORAL | 0 refills | Status: DC
  Filled 2022-12-25: qty 30, 30d supply, fill #0

## 2023-01-16 ENCOUNTER — Other Ambulatory Visit (HOSPITAL_COMMUNITY): Payer: Self-pay

## 2023-01-16 MED ORDER — ZEPBOUND 5 MG/0.5ML ~~LOC~~ SOAJ: 5.0000 mg | SUBCUTANEOUS | 0 refills | Status: DC

## 2023-01-16 MED ORDER — LISDEXAMFETAMINE DIMESYLATE 40 MG PO CAPS
40.0000 mg | ORAL_CAPSULE | ORAL | 0 refills | Status: DC
  Filled 2023-01-16: qty 30, 30d supply, fill #0

## 2023-01-16 MED ORDER — WEGOVY 1 MG/0.5ML ~~LOC~~ SOAJ
1.0000 mg | SUBCUTANEOUS | 0 refills | Status: DC
  Filled 2023-01-16: qty 2, 28d supply, fill #0

## 2023-01-29 ENCOUNTER — Other Ambulatory Visit (HOSPITAL_COMMUNITY): Payer: Self-pay

## 2023-01-29 MED ORDER — LISDEXAMFETAMINE DIMESYLATE 30 MG PO CAPS
30.0000 mg | ORAL_CAPSULE | Freq: Every day | ORAL | 0 refills | Status: DC
  Filled 2023-01-29: qty 30, 30d supply, fill #0

## 2023-01-29 MED ORDER — LISDEXAMFETAMINE DIMESYLATE 40 MG PO CAPS
40.0000 mg | ORAL_CAPSULE | Freq: Every morning | ORAL | 0 refills | Status: DC
  Filled 2023-02-25: qty 30, 30d supply, fill #0

## 2023-01-29 MED ORDER — ZEPBOUND 5 MG/0.5ML ~~LOC~~ SOAJ
5.0000 mg | SUBCUTANEOUS | 0 refills | Status: DC
  Filled 2023-01-29: qty 2, 28d supply, fill #0

## 2023-02-05 ENCOUNTER — Other Ambulatory Visit (HOSPITAL_COMMUNITY): Payer: Self-pay

## 2023-02-06 ENCOUNTER — Other Ambulatory Visit (HOSPITAL_COMMUNITY): Payer: Self-pay

## 2023-02-10 ENCOUNTER — Other Ambulatory Visit (HOSPITAL_COMMUNITY): Payer: Self-pay

## 2023-02-11 ENCOUNTER — Other Ambulatory Visit (HOSPITAL_COMMUNITY): Payer: Self-pay

## 2023-02-11 MED ORDER — ZEPBOUND 15 MG/0.5ML ~~LOC~~ SOAJ
15.0000 mg | SUBCUTANEOUS | 3 refills | Status: DC
  Filled 2023-02-11 – 2023-02-22 (×2): qty 2, 28d supply, fill #0
  Filled ????-??-??: fill #0

## 2023-02-12 ENCOUNTER — Other Ambulatory Visit (HOSPITAL_COMMUNITY): Payer: Self-pay

## 2023-02-12 MED ORDER — LISDEXAMFETAMINE DIMESYLATE 40 MG PO CAPS: 40.0000 mg | ORAL_CAPSULE | Freq: Every morning | ORAL | 0 refills | Status: DC

## 2023-02-13 ENCOUNTER — Other Ambulatory Visit (HOSPITAL_COMMUNITY): Payer: Self-pay

## 2023-02-19 ENCOUNTER — Other Ambulatory Visit (HOSPITAL_COMMUNITY): Payer: Self-pay

## 2023-02-20 ENCOUNTER — Other Ambulatory Visit (HOSPITAL_COMMUNITY): Payer: Self-pay

## 2023-02-22 ENCOUNTER — Other Ambulatory Visit (HOSPITAL_COMMUNITY): Payer: Self-pay

## 2023-02-24 ENCOUNTER — Other Ambulatory Visit (HOSPITAL_COMMUNITY): Payer: Self-pay

## 2023-02-25 ENCOUNTER — Other Ambulatory Visit (HOSPITAL_COMMUNITY): Payer: Self-pay

## 2023-03-17 ENCOUNTER — Other Ambulatory Visit (HOSPITAL_COMMUNITY): Payer: Self-pay

## 2023-03-17 MED ORDER — ZEPBOUND 15 MG/0.5ML ~~LOC~~ SOAJ
15.0000 mg | SUBCUTANEOUS | 3 refills | Status: DC
  Filled 2023-03-17: qty 2, 28d supply, fill #0

## 2023-03-18 ENCOUNTER — Other Ambulatory Visit (HOSPITAL_COMMUNITY): Payer: Self-pay

## 2023-03-20 ENCOUNTER — Other Ambulatory Visit (HOSPITAL_COMMUNITY): Payer: Self-pay

## 2023-03-26 ENCOUNTER — Other Ambulatory Visit (HOSPITAL_COMMUNITY): Payer: Self-pay

## 2023-04-07 ENCOUNTER — Other Ambulatory Visit: Payer: Self-pay

## 2023-04-07 ENCOUNTER — Other Ambulatory Visit (HOSPITAL_COMMUNITY): Payer: Self-pay

## 2023-04-07 MED ORDER — LISDEXAMFETAMINE DIMESYLATE 30 MG PO CAPS
30.0000 mg | ORAL_CAPSULE | Freq: Every day | ORAL | 0 refills | Status: DC
  Filled 2023-04-07: qty 30, 30d supply, fill #0

## 2023-04-07 MED ORDER — LISDEXAMFETAMINE DIMESYLATE 40 MG PO CAPS
40.0000 mg | ORAL_CAPSULE | Freq: Every day | ORAL | 0 refills | Status: DC
  Filled 2023-04-07: qty 30, 30d supply, fill #0

## 2023-04-09 ENCOUNTER — Other Ambulatory Visit (HOSPITAL_COMMUNITY): Payer: Self-pay

## 2023-05-05 ENCOUNTER — Other Ambulatory Visit (HOSPITAL_COMMUNITY): Payer: Self-pay

## 2023-05-05 MED ORDER — LISDEXAMFETAMINE DIMESYLATE 40 MG PO CAPS
40.0000 mg | ORAL_CAPSULE | Freq: Every morning | ORAL | 0 refills | Status: DC
  Filled 2023-05-05 – 2023-05-06 (×2): qty 30, 30d supply, fill #0

## 2023-05-06 ENCOUNTER — Other Ambulatory Visit (HOSPITAL_COMMUNITY): Payer: Self-pay

## 2023-05-06 ENCOUNTER — Other Ambulatory Visit: Payer: Self-pay

## 2023-05-20 DIAGNOSIS — H903 Sensorineural hearing loss, bilateral: Secondary | ICD-10-CM | POA: Insufficient documentation

## 2023-06-12 ENCOUNTER — Other Ambulatory Visit (HOSPITAL_COMMUNITY): Payer: Self-pay

## 2023-06-12 MED ORDER — LISDEXAMFETAMINE DIMESYLATE 30 MG PO CAPS
ORAL_CAPSULE | ORAL | 0 refills | Status: DC
  Filled 2023-06-12: qty 30, 30d supply, fill #0

## 2023-06-12 MED ORDER — LISDEXAMFETAMINE DIMESYLATE 40 MG PO CAPS
40.0000 mg | ORAL_CAPSULE | Freq: Every morning | ORAL | 0 refills | Status: DC
  Filled 2023-06-12: qty 30, 30d supply, fill #0

## 2023-06-20 ENCOUNTER — Other Ambulatory Visit (HOSPITAL_COMMUNITY): Payer: Self-pay

## 2023-06-21 ENCOUNTER — Other Ambulatory Visit (HOSPITAL_COMMUNITY): Payer: Self-pay

## 2023-07-31 ENCOUNTER — Other Ambulatory Visit (HOSPITAL_COMMUNITY): Payer: Self-pay

## 2023-07-31 MED ORDER — LISDEXAMFETAMINE DIMESYLATE 40 MG PO CAPS
40.0000 mg | ORAL_CAPSULE | Freq: Every morning | ORAL | 0 refills | Status: DC
  Filled 2023-08-06: qty 30, 30d supply, fill #0

## 2023-07-31 MED ORDER — LISDEXAMFETAMINE DIMESYLATE 30 MG PO CAPS
30.0000 mg | ORAL_CAPSULE | Freq: Every day | ORAL | 0 refills | Status: DC
  Filled 2023-07-31: qty 30, 30d supply, fill #0

## 2023-08-06 ENCOUNTER — Other Ambulatory Visit (HOSPITAL_COMMUNITY): Payer: Self-pay

## 2023-09-04 ENCOUNTER — Other Ambulatory Visit (HOSPITAL_COMMUNITY): Payer: Self-pay

## 2023-09-04 MED ORDER — LISDEXAMFETAMINE DIMESYLATE 40 MG PO CAPS
40.0000 mg | ORAL_CAPSULE | Freq: Every morning | ORAL | 0 refills | Status: DC
  Filled 2023-09-04: qty 30, 30d supply, fill #0

## 2023-09-04 MED ORDER — LISDEXAMFETAMINE DIMESYLATE 30 MG PO CAPS
30.0000 mg | ORAL_CAPSULE | Freq: Every day | ORAL | 0 refills | Status: DC
  Filled 2023-09-04: qty 30, 30d supply, fill #0

## 2023-09-05 ENCOUNTER — Other Ambulatory Visit (HOSPITAL_COMMUNITY): Payer: Self-pay

## 2023-09-13 ENCOUNTER — Other Ambulatory Visit (HOSPITAL_COMMUNITY): Payer: Self-pay

## 2023-10-09 ENCOUNTER — Other Ambulatory Visit (HOSPITAL_COMMUNITY): Payer: Self-pay

## 2023-10-09 MED ORDER — LISDEXAMFETAMINE DIMESYLATE 40 MG PO CAPS
40.0000 mg | ORAL_CAPSULE | Freq: Every morning | ORAL | 0 refills | Status: DC
  Filled 2023-10-09: qty 30, 30d supply, fill #0

## 2023-10-09 MED ORDER — LISDEXAMFETAMINE DIMESYLATE 30 MG PO CAPS
30.0000 mg | ORAL_CAPSULE | Freq: Every day | ORAL | 0 refills | Status: DC
  Filled 2023-10-09: qty 30, 30d supply, fill #0

## 2023-10-10 ENCOUNTER — Other Ambulatory Visit (HOSPITAL_COMMUNITY): Payer: Self-pay

## 2023-11-10 ENCOUNTER — Other Ambulatory Visit (HOSPITAL_BASED_OUTPATIENT_CLINIC_OR_DEPARTMENT_OTHER): Payer: Self-pay

## 2023-11-10 MED ORDER — LISDEXAMFETAMINE DIMESYLATE 30 MG PO CAPS
30.0000 mg | ORAL_CAPSULE | Freq: Every day | ORAL | 0 refills | Status: DC
  Filled 2023-11-10: qty 30, 30d supply, fill #0

## 2023-11-10 MED ORDER — LISDEXAMFETAMINE DIMESYLATE 40 MG PO CAPS
40.0000 mg | ORAL_CAPSULE | Freq: Every morning | ORAL | 0 refills | Status: DC
  Filled 2023-11-10: qty 30, 30d supply, fill #0

## 2023-12-17 ENCOUNTER — Other Ambulatory Visit (HOSPITAL_BASED_OUTPATIENT_CLINIC_OR_DEPARTMENT_OTHER): Payer: Self-pay

## 2023-12-17 MED ORDER — LISDEXAMFETAMINE DIMESYLATE 30 MG PO CAPS
30.0000 mg | ORAL_CAPSULE | Freq: Every day | ORAL | 0 refills | Status: DC
  Filled 2023-12-17 – 2024-01-01 (×2): qty 30, 30d supply, fill #0

## 2023-12-17 MED ORDER — LISDEXAMFETAMINE DIMESYLATE 40 MG PO CAPS
40.0000 mg | ORAL_CAPSULE | Freq: Every morning | ORAL | 0 refills | Status: DC
  Filled 2023-12-17 – 2024-01-01 (×2): qty 30, 30d supply, fill #0

## 2023-12-29 ENCOUNTER — Other Ambulatory Visit (HOSPITAL_BASED_OUTPATIENT_CLINIC_OR_DEPARTMENT_OTHER): Payer: Self-pay

## 2024-01-01 ENCOUNTER — Other Ambulatory Visit (HOSPITAL_BASED_OUTPATIENT_CLINIC_OR_DEPARTMENT_OTHER): Payer: Self-pay

## 2024-01-29 ENCOUNTER — Other Ambulatory Visit (HOSPITAL_BASED_OUTPATIENT_CLINIC_OR_DEPARTMENT_OTHER): Payer: Self-pay

## 2024-01-29 MED ORDER — BUPROPION HCL ER (XL) 150 MG PO TB24
150.0000 mg | ORAL_TABLET | Freq: Every morning | ORAL | 0 refills | Status: AC
  Filled 2024-01-29: qty 90, 90d supply, fill #0

## 2024-01-29 MED ORDER — SERTRALINE HCL 25 MG PO TABS
25.0000 mg | ORAL_TABLET | Freq: Every day | ORAL | 0 refills | Status: DC
  Filled 2024-01-29: qty 30, 30d supply, fill #0

## 2024-01-30 ENCOUNTER — Other Ambulatory Visit (HOSPITAL_BASED_OUTPATIENT_CLINIC_OR_DEPARTMENT_OTHER): Payer: Self-pay

## 2024-01-31 ENCOUNTER — Other Ambulatory Visit (HOSPITAL_BASED_OUTPATIENT_CLINIC_OR_DEPARTMENT_OTHER): Payer: Self-pay

## 2024-01-31 MED ORDER — LISDEXAMFETAMINE DIMESYLATE 40 MG PO CAPS
40.0000 mg | ORAL_CAPSULE | Freq: Every morning | ORAL | 0 refills | Status: DC
  Filled 2024-01-31: qty 30, 30d supply, fill #0

## 2024-01-31 MED ORDER — LISDEXAMFETAMINE DIMESYLATE 30 MG PO CAPS
30.0000 mg | ORAL_CAPSULE | Freq: Every day | ORAL | 0 refills | Status: DC
  Filled 2024-01-31: qty 30, 30d supply, fill #0

## 2024-03-02 ENCOUNTER — Other Ambulatory Visit (HOSPITAL_BASED_OUTPATIENT_CLINIC_OR_DEPARTMENT_OTHER): Payer: Self-pay

## 2024-03-02 MED ORDER — SERTRALINE HCL 25 MG PO TABS
25.0000 mg | ORAL_TABLET | Freq: Every day | ORAL | 1 refills | Status: DC
  Filled 2024-03-02: qty 30, 30d supply, fill #0

## 2024-03-03 ENCOUNTER — Other Ambulatory Visit (HOSPITAL_BASED_OUTPATIENT_CLINIC_OR_DEPARTMENT_OTHER): Payer: Self-pay

## 2024-03-04 ENCOUNTER — Other Ambulatory Visit (HOSPITAL_BASED_OUTPATIENT_CLINIC_OR_DEPARTMENT_OTHER): Payer: Self-pay

## 2024-03-04 MED ORDER — LISDEXAMFETAMINE DIMESYLATE 30 MG PO CAPS
30.0000 mg | ORAL_CAPSULE | Freq: Every day | ORAL | 0 refills | Status: DC
  Filled 2024-05-14: qty 30, 30d supply, fill #0

## 2024-03-04 MED ORDER — LISDEXAMFETAMINE DIMESYLATE 30 MG PO CAPS
30.0000 mg | ORAL_CAPSULE | Freq: Every day | ORAL | 0 refills | Status: DC
  Filled 2024-03-04: qty 30, 30d supply, fill #0

## 2024-03-04 MED ORDER — LISDEXAMFETAMINE DIMESYLATE 40 MG PO CAPS
40.0000 mg | ORAL_CAPSULE | Freq: Every morning | ORAL | 0 refills | Status: DC
  Filled 2024-03-04: qty 30, 30d supply, fill #0

## 2024-03-04 MED ORDER — LISDEXAMFETAMINE DIMESYLATE 40 MG PO CAPS
40.0000 mg | ORAL_CAPSULE | Freq: Every day | ORAL | 0 refills | Status: DC
Start: 2024-03-04 — End: 2024-07-19
  Filled 2024-05-14: qty 30, 30d supply, fill #0

## 2024-05-14 ENCOUNTER — Other Ambulatory Visit (HOSPITAL_BASED_OUTPATIENT_CLINIC_OR_DEPARTMENT_OTHER): Payer: Self-pay

## 2024-05-15 ENCOUNTER — Other Ambulatory Visit (HOSPITAL_BASED_OUTPATIENT_CLINIC_OR_DEPARTMENT_OTHER): Payer: Self-pay

## 2024-05-20 ENCOUNTER — Other Ambulatory Visit (HOSPITAL_BASED_OUTPATIENT_CLINIC_OR_DEPARTMENT_OTHER): Payer: Self-pay

## 2024-05-20 MED ORDER — WEGOVY 2.4 MG/0.75ML ~~LOC~~ SOAJ
2.4000 mg | SUBCUTANEOUS | 0 refills | Status: AC
  Filled 2024-05-20: qty 3, 28d supply, fill #0

## 2024-05-24 ENCOUNTER — Other Ambulatory Visit (HOSPITAL_BASED_OUTPATIENT_CLINIC_OR_DEPARTMENT_OTHER): Payer: Self-pay

## 2024-07-19 ENCOUNTER — Other Ambulatory Visit (HOSPITAL_BASED_OUTPATIENT_CLINIC_OR_DEPARTMENT_OTHER): Payer: Self-pay

## 2024-07-19 MED ORDER — LISDEXAMFETAMINE DIMESYLATE 40 MG PO CAPS
40.0000 mg | ORAL_CAPSULE | Freq: Every morning | ORAL | 0 refills | Status: DC
  Filled 2024-07-19: qty 30, 30d supply, fill #0

## 2024-07-19 MED ORDER — LISDEXAMFETAMINE DIMESYLATE 30 MG PO CAPS
30.0000 mg | ORAL_CAPSULE | Freq: Every evening | ORAL | 0 refills | Status: DC
  Filled 2024-07-19: qty 30, 30d supply, fill #0

## 2024-08-09 ENCOUNTER — Other Ambulatory Visit (HOSPITAL_BASED_OUTPATIENT_CLINIC_OR_DEPARTMENT_OTHER): Payer: Self-pay

## 2024-08-09 MED ORDER — LISDEXAMFETAMINE DIMESYLATE 40 MG PO CAPS: 40.0000 mg | ORAL_CAPSULE | Freq: Every morning | ORAL | 0 refills | Status: DC

## 2024-08-09 MED ORDER — LISDEXAMFETAMINE DIMESYLATE 30 MG PO CAPS: 30.0000 mg | ORAL_CAPSULE | Freq: Every day | ORAL | 0 refills | Status: DC

## 2024-09-06 ENCOUNTER — Other Ambulatory Visit (HOSPITAL_BASED_OUTPATIENT_CLINIC_OR_DEPARTMENT_OTHER): Payer: Self-pay

## 2024-09-06 MED ORDER — LISDEXAMFETAMINE DIMESYLATE 40 MG PO CAPS
40.0000 mg | ORAL_CAPSULE | Freq: Every morning | ORAL | 0 refills | Status: DC
  Filled 2024-09-06: qty 30, 30d supply, fill #0

## 2024-09-06 MED ORDER — LISDEXAMFETAMINE DIMESYLATE 40 MG PO CAPS: 40.0000 mg | ORAL_CAPSULE | Freq: Every morning | ORAL | 0 refills | Status: DC

## 2024-09-06 MED ORDER — LISDEXAMFETAMINE DIMESYLATE 30 MG PO CAPS
30.0000 mg | ORAL_CAPSULE | Freq: Every evening | ORAL | 0 refills | Status: DC
  Filled 2024-09-06: qty 30, 30d supply, fill #0

## 2024-09-06 MED ORDER — LISDEXAMFETAMINE DIMESYLATE 30 MG PO CAPS: 30.0000 mg | ORAL_CAPSULE | Freq: Every day | ORAL | 0 refills | Status: DC

## 2024-09-07 ENCOUNTER — Other Ambulatory Visit (HOSPITAL_BASED_OUTPATIENT_CLINIC_OR_DEPARTMENT_OTHER): Payer: Self-pay

## 2024-09-07 ENCOUNTER — Other Ambulatory Visit: Payer: Self-pay

## 2024-11-02 ENCOUNTER — Ambulatory Visit: Payer: Self-pay | Admitting: Family Medicine

## 2024-11-02 ENCOUNTER — Encounter: Payer: Self-pay | Admitting: Family Medicine

## 2024-11-02 VITALS — BP 118/74 | HR 78 | Ht 61.5 in | Wt 139.8 lb

## 2024-11-02 DIAGNOSIS — F339 Major depressive disorder, recurrent, unspecified: Secondary | ICD-10-CM

## 2024-11-02 DIAGNOSIS — R635 Abnormal weight gain: Secondary | ICD-10-CM

## 2024-11-02 DIAGNOSIS — F909 Attention-deficit hyperactivity disorder, unspecified type: Secondary | ICD-10-CM

## 2024-11-02 DIAGNOSIS — L821 Other seborrheic keratosis: Secondary | ICD-10-CM | POA: Insufficient documentation

## 2024-11-02 DIAGNOSIS — N958 Other specified menopausal and perimenopausal disorders: Secondary | ICD-10-CM

## 2024-11-02 DIAGNOSIS — D225 Melanocytic nevi of trunk: Secondary | ICD-10-CM | POA: Insufficient documentation

## 2024-11-02 NOTE — Progress Notes (Unsigned)
 1. Weight gain   2. Attention deficit hyperactivity disorder (ADHD), controlled with Vyvanse    3. Genitourinary syndrome of menopause   4. Depression, recurrent, stable on Zoloft     Meds ordered this encounter  Medications  . lisdexamfetamine (VYVANSE ) 30 MG capsule    Sig: Take 1 capsule by mouth once daily at noon. 30 days    Dispense:  30 capsule    Refill:  0  . lisdexamfetamine (VYVANSE ) 40 MG capsule    Sig: Take 1 capsule (40 mg total) by mouth every morning.    Dispense:  30 capsule    Refill:  0  . sertraline  (ZOLOFT ) 25 MG tablet    Sig: Take 1 tablet (25 mg total) by mouth daily.    Dispense:  90 tablet    Refill:  1   Assessment and Plan Assessment & Plan Overweight Weight gain despite regular exercise. Current Wegovy  dose may be ineffective. Discussed maintaining routine with protein shakes and reducing snack intake. - Continue Wegovy  at 6 units weekly. - Consider increasing Wegovy  dose if current dose is ineffective - 10-12 units.  - Encouraged restarting routine protein shakes. - Advised reducing snack intake, particularly gummies and hard candy.  Postmenopausal vaginal atrophy Symptoms consistent with vaginal atrophy. Discussed benefits of estrogen cream for vaginal health. Prescribed estrogen cream for vaginal application. - Instructed on application technique: pea-sized amount around the introitus.  General Health Maintenance Engaged in regular physical activity and healthy lifestyle. Discussed importance of exercise, healthy eating, and social activities for mental well-being. - Continue regular exercise routine. - Encouraged participation in social activities and hobbies.  Geni Shutter, DO, MS, FAAFP, Dipl. [facility] [address] [place], [address] Dept: [phone] Dept Fax: [phone]  Subjective:   Patient is well-known to me from previous care setting and is establishing care in this  system with me as PCP. Prior records reviewed when available. Chart updated today with reconciliation of problem list, medications, allergies, and relevant history. Preventive care and chronic disease status reviewed. Portions of historical chart may remain incomplete; will update on an ongoing basis as clinically indicated.  Discussed the use of AI scribe software for clinical note transcription with the patient, who gave verbal consent to proceed. History of Present Illness Toni Hamilton is a 58 year old female who presents with concerns about weight management and anxiety.  Weight gain and management - Recent increase in weight despite regular exercise regimen including Zumba, yoga, and weight training. - Currently using Wegovy  at six units weekly without experiencing side effects. - Questions the effectiveness of Wegovy , particularly during the holiday season.  Anxiety symptoms - Anxiety primarily related to her husband's job stress and her recent retirement/boredom.   Cognitive concerns - Concerned about brain fog and memory issues. - Family history of dementia.  Menopausal status and gynecologic history - Likely underwent menopause 5-10 years ago without significant symptoms. - Recent removal of intrauterine device (IUD).  Review of Systems: Negative, with the exception of above mentioned in HPI.  Current Outpatient Medications:  .  acetaminophen (TYLENOL) 650 MG CR tablet, Take 1,300 mg by mouth daily as needed for pain., Disp: , Rfl:  .  amLODipine (NORVASC) 5 MG tablet, Take 5 mg by mouth daily., Disp: , Rfl:  .  buPROPion  (WELLBUTRIN  XL) 150 MG 24 hr tablet, Take 1 tablet (150 mg total) by mouth in the morning., Disp: 90 tablet, Rfl: 0 .  lisinopril (PRINIVIL,ZESTRIL) 10  MG tablet, Take 10 mg by mouth daily., Disp: , Rfl:  .  OVER THE COUNTER MEDICATION, Vitamin D2 1.25 mg one tablet weekly, Disp: , Rfl:  .  Semaglutide -Weight Management (WEGOVY ) 2.4 MG/0.75ML SOAJ,  Inject 2.4 mg into the skin once a week., Disp: 3 mL, Rfl: 0 .  traZODone (DESYREL) 50 MG tablet, Take 50-100 mg by mouth at bedtime., Disp: , Rfl:  .  lisdexamfetamine (VYVANSE ) 30 MG capsule, Take 1 capsule by mouth once daily at noon. 30 days, Disp: 30 capsule, Rfl: 0 .  lisdexamfetamine (VYVANSE ) 40 MG capsule, Take 1 capsule (40 mg total) by mouth every morning., Disp: 30 capsule, Rfl: 0 .  sertraline  (ZOLOFT ) 25 MG tablet, Take 1 tablet (25 mg total) by mouth daily., Disp: 90 tablet, Rfl: 1   Objective:   BP 118/74 (BP Location: Right Arm, Cuff Size: Normal)   Pulse 78   Ht 5' 1.5 (1.562 m)   Wt 139 lb 12.8 oz (63.4 kg)   SpO2 97%   BMI 25.99 kg/m   Wt Readings from Last 3 Encounters:  11/02/24 139 lb 12.8 oz (63.4 kg)  04/24/18 156 lb (70.8 kg)  04/30/16 182 lb (82.6 kg)   Physical Exam Constitutional:      General: She is not in acute distress.    Appearance: She is well-developed. She is obese.  HENT:     Head: Normocephalic and atraumatic.  Eyes:     Conjunctiva/sclera: Conjunctivae normal.  Cardiovascular:     Rate and Rhythm: Normal rate and regular rhythm.     Heart sounds: Normal heart sounds.  Pulmonary:     Effort: Pulmonary effort is normal.     Breath sounds: Normal breath sounds.  Musculoskeletal:     Cervical back: Normal range of motion and neck supple.  Neurological:     General: No focal deficit present.     Mental Status: She is alert and oriented to person, place, and time.  Psychiatric:        Mood and Affect: Mood normal.        Behavior: Behavior normal.   Note: Other outside labs reviewed and will be abstracted.

## 2024-11-03 ENCOUNTER — Encounter: Payer: Self-pay | Admitting: Family Medicine

## 2024-11-03 ENCOUNTER — Other Ambulatory Visit (HOSPITAL_BASED_OUTPATIENT_CLINIC_OR_DEPARTMENT_OTHER): Payer: Self-pay

## 2024-11-03 ENCOUNTER — Other Ambulatory Visit: Payer: Self-pay

## 2024-11-03 MED ORDER — SERTRALINE HCL 25 MG PO TABS: 25.0000 mg | ORAL_TABLET | Freq: Every day | ORAL | 1 refills | Status: AC

## 2024-11-03 MED ORDER — LISDEXAMFETAMINE DIMESYLATE 30 MG PO CAPS
30.0000 mg | ORAL_CAPSULE | Freq: Every day | ORAL | 0 refills | Status: DC
  Filled 2024-11-03: qty 30, 30d supply, fill #0

## 2024-11-03 MED ORDER — LISDEXAMFETAMINE DIMESYLATE 40 MG PO CAPS
40.0000 mg | ORAL_CAPSULE | ORAL | 0 refills | Status: DC
  Filled 2024-11-03: qty 30, 30d supply, fill #0

## 2024-11-03 NOTE — Patient Instructions (Signed)
 Estrogen Vaginal Cream - Instructions  You are using estrogen vaginal cream to improve vaginal dryness, irritation, discomfort with intercourse, and urinary symptoms related to low estrogen.  How to Use  For applicator use: wash hands ? fill applicator to prescribed dose ? insert into vagina ? release medication ? rinse/dry applicator.  For fingertip dosing: apply a pea-sized amount just inside the vaginal opening and along the lower vaginal tissue. You may also apply to the external opening if irritated.  Typical schedule: nightly for 1-2 weeks, then decrease to 2-3 times weekly at bedtime for ongoing maintenance.  Bedtime use limits drainage and improves absorption.  Expected Effects  Improvement usually begins within 2-4 weeks.  Some normal drainage may occur the next day.  Mild irritation or burning may occur early as tissues heal.  Possible Side Effects  Mild irritation or burning  Increased moisture or discharge  Occasional light spotting or breast tenderness  Cost Savings  Using a pea-sized amount (when appropriate) helps extend each tube.  Maintenance dosing at 2-3 times/week keeps cost lower while maintaining benefit.  Estradiol 0.1 mg/g vaginal cream from [facility] is usually ~$13-$15 for a 30-gram tube (plus minimal shipping). Prices may vary.  Follow-Up We will reassess symptoms at your next visit. Message anytime with questions about dosing, refills, or cost options.

## 2024-11-04 ENCOUNTER — Other Ambulatory Visit (HOSPITAL_BASED_OUTPATIENT_CLINIC_OR_DEPARTMENT_OTHER): Payer: Self-pay

## 2024-12-03 ENCOUNTER — Other Ambulatory Visit (HOSPITAL_BASED_OUTPATIENT_CLINIC_OR_DEPARTMENT_OTHER): Payer: Self-pay

## 2024-12-03 MED ORDER — LISDEXAMFETAMINE DIMESYLATE 40 MG PO CAPS
40.0000 mg | ORAL_CAPSULE | Freq: Every morning | ORAL | 0 refills | Status: DC
  Filled 2024-12-03: qty 30, 30d supply, fill #0

## 2024-12-03 MED ORDER — LISDEXAMFETAMINE DIMESYLATE 30 MG PO CAPS
30.0000 mg | ORAL_CAPSULE | Freq: Every day | ORAL | 0 refills | Status: DC
  Filled 2024-12-03: qty 30, 30d supply, fill #0

## 2024-12-06 ENCOUNTER — Other Ambulatory Visit (HOSPITAL_BASED_OUTPATIENT_CLINIC_OR_DEPARTMENT_OTHER): Payer: Self-pay

## 2024-12-14 ENCOUNTER — Ambulatory Visit: Admitting: Family Medicine

## 2024-12-14 VITALS — BP 112/60 | HR 60 | Ht 61.5 in | Wt 134.2 lb

## 2024-12-14 DIAGNOSIS — E782 Mixed hyperlipidemia: Secondary | ICD-10-CM

## 2024-12-14 DIAGNOSIS — F909 Attention-deficit hyperactivity disorder, unspecified type: Secondary | ICD-10-CM

## 2024-12-14 DIAGNOSIS — F339 Major depressive disorder, recurrent, unspecified: Secondary | ICD-10-CM

## 2024-12-14 DIAGNOSIS — Z6824 Body mass index (BMI) 24.0-24.9, adult: Secondary | ICD-10-CM

## 2024-12-14 DIAGNOSIS — H903 Sensorineural hearing loss, bilateral: Secondary | ICD-10-CM

## 2024-12-14 DIAGNOSIS — I10 Essential (primary) hypertension: Secondary | ICD-10-CM

## 2024-12-14 DIAGNOSIS — R7303 Prediabetes: Secondary | ICD-10-CM

## 2024-12-14 MED ORDER — LISDEXAMFETAMINE DIMESYLATE 60 MG PO CHEW: 60.0000 mg | CHEWABLE_TABLET | Freq: Every morning | ORAL | 0 refills | Status: AC

## 2024-12-14 NOTE — Progress Notes (Incomplete)
 "    Patient Care Team: Prentiss Frieze, DO as PCP - General (Family Medicine)  Weight Management:   Starting weight: 168 lb Starting date: 08/13/2022 Today's weight: 134 lb Today's date: 12/14/2024 Total lbs lost to date: 34 lb Total lbs lost since last in-office visit: -5 lbs Total weight loss percentage to date: -20.24% Body mass index is 24.95 kg/m.   Resting Metabolic Rate: *** Nutrition Plan: {EW NUTRITION HNJOD:65522}. Anti-obesity medications (including off-label): ***. Reported side effects: ***. Hunger is {EWCONTROLASSESSMENT:24261}. Cravings are {EWCONTROLASSESSMENT:24261}. Activity: {MWM EXERCISE RECS:23473}. Sleep: Number of hours slept each night: ***. Sleep {ACTION; IS/IS NOT:21021397} restful.   Diagnoses and Orders:   No diagnosis found. No orders of the defined types were placed in this encounter.  No orders of the defined types were placed in this encounter.  Assessment & Plan:   Assessment and Plan Assessment & Plan     Frieze Prentiss, DO, MS, FAAFP, Dipl. [facility] [address] [place], [address] Dept: [phone] Dept Fax: [phone]  Subjective:   History of Present Illness   Review of Systems: Negative, with the exception of above mentioned in HPI.  History:   Reviewed by clinician on day of visit: allergies, medications, problem list, medical history, surgical history, family history, social history, and previous encounter notes.  Medications:   Show/hide medication list[1] Allergies[2]  Objective:   BP 112/60 (BP Location: Right Arm, Cuff Size: Normal)   Pulse 60   Ht 5' 1.5 (1.562 m)   Wt 134 lb 3.2 oz (60.9 kg)   SpO2 95%   BMI 24.95 kg/m  {Insert last BP/Wt (optional):23777}{See vitals history (optional):1}   Physical Exam {Insert previous labs (optional):23779} {See past labs  Heme  Chem  Endocrine  Serology  Results Review (optional):1}  Results for  orders placed or performed in visit on 04/24/18  CBC with Differential (Cancer Center Only)   Collection Time: 04/24/18  8:04 AM  Result Value Ref Range   WBC Count 7.1 3.9 - 10.0 K/uL   RBC 4.23 3.70 - 5.32 MIL/uL   Hemoglobin 14.0 11.6 - 15.9 g/dL   HCT 57.7 65.1 - 53.3 %   MCV 99.8 81.0 - 101.0 fL   MCH 33.1 26.0 - 34.0 pg   MCHC 33.2 32.0 - 36.0 g/dL   RDW 87.5 88.8 - 84.2 %   Platelet Count 523 (H) 145 - 400 K/uL   Neutrophils Relative % 55 %   Neutro Abs 3.9 1.5 - 6.5 K/uL   Lymphocytes Relative 32 %   Lymphs Abs 2.2 0.9 - 3.3 K/uL   Monocytes Relative 8 %   Monocytes Absolute 0.6 0.1 - 0.9 K/uL   Eosinophils Relative 5 %   Eosinophils Absolute 0.3 0.0 - 0.5 K/uL   Basophils Relative 0 %   Basophils Absolute 0.0 0.0 - 0.1 K/uL  Ferritin   Collection Time: 04/24/18  8:04 AM  Result Value Ref Range   Ferritin 172 9 - 269 ng/mL  Iron and TIBC   Collection Time: 04/24/18  8:04 AM  Result Value Ref Range   Iron 108 41 - 142 ug/dL   TIBC 699 763 - 555 ug/dL   Saturation Ratios 36 21 - 57 %   UIBC 192 ug/dL  Soluble transferrin receptor   Collection Time: 04/24/18  8:04 AM  Result Value Ref Range   Transferrin Receptor 8.3 (L) 12.2 - 27.3 nmol/L    12/14/2024    PHQ2-9 Depression  Screening   Little interest or pleasure in doing things    Feeling down, depressed, or hopeless    PHQ-2 - Total Score    Trouble falling or staying asleep, or sleeping too much    Feeling tired or having little energy    Poor appetite or overeating     Feeling bad about yourself - or that you are a failure or have let yourself or your family down    Trouble concentrating on things, such as reading the newspaper or watching television    Moving or speaking so slowly that other people could have noticed.  Or the opposite - being so fidgety or restless that you have been moving around a lot more than usual    Thoughts that you would be better off dead, or hurting yourself in some way    PHQ2-9  Total Score    If you checked off any problems, how difficult have these problems made it for you to do your work, take care of things at home, or get along with other people    Depression Interventions/Treatment          No data to display         Attestations:   {EW ATTESTATIONS:34266}  Geni Shutter, DO, MS, FAAFP, Dipl. [facility] at [facility] [address] [place], [address] Dept: [phone] Dept Fax: [phone]     [1]  Outpatient Medications Prior to Visit  Medication Sig   acetaminophen (TYLENOL) 650 MG CR tablet Take 1,300 mg by mouth daily as needed for pain.   amLODipine (NORVASC) 5 MG tablet Take 5 mg by mouth daily.   buPROPion  (WELLBUTRIN  XL) 150 MG 24 hr tablet Take 1 tablet (150 mg total) by mouth in the morning.   lisdexamfetamine  (VYVANSE ) 30 MG capsule Take 1 capsule (30 mg total) by mouth daily in the afternoon.   lisdexamfetamine  (VYVANSE ) 40 MG capsule Take 1 capsule (40 mg total) by mouth in the morning.   lisinopril (PRINIVIL,ZESTRIL) 10 MG tablet Take 10 mg by mouth daily.   OVER THE COUNTER MEDICATION Vitamin D2 1.25 mg one tablet weekly   Semaglutide -Weight Management (WEGOVY ) 2.4 MG/0.75ML SOAJ Inject 2.4 mg into the skin once a week.   sertraline  (ZOLOFT ) 25 MG tablet Take 1 tablet (25 mg total) by mouth daily.   traZODone (DESYREL) 50 MG tablet Take 50-100 mg by mouth at bedtime.   No facility-administered medications prior to visit.  [2] No Known Allergies  "

## 2024-12-15 ENCOUNTER — Encounter: Payer: Self-pay | Admitting: Family Medicine

## 2024-12-15 DIAGNOSIS — F339 Major depressive disorder, recurrent, unspecified: Secondary | ICD-10-CM | POA: Insufficient documentation

## 2024-12-15 DIAGNOSIS — E782 Mixed hyperlipidemia: Secondary | ICD-10-CM | POA: Insufficient documentation

## 2024-12-15 DIAGNOSIS — Z6824 Body mass index (BMI) 24.0-24.9, adult: Secondary | ICD-10-CM | POA: Insufficient documentation

## 2024-12-15 DIAGNOSIS — I10 Essential (primary) hypertension: Secondary | ICD-10-CM | POA: Insufficient documentation

## 2024-12-15 DIAGNOSIS — R7303 Prediabetes: Secondary | ICD-10-CM | POA: Insufficient documentation

## 2024-12-15 DIAGNOSIS — F909 Attention-deficit hyperactivity disorder, unspecified type: Secondary | ICD-10-CM | POA: Insufficient documentation

## 2024-12-15 NOTE — Progress Notes (Signed)
 "    Patient Care Team: Prentiss Frieze, DO as PCP - General (Family Medicine)  Weight Management:   Starting weight: 168 lb Starting date: 08/13/2022 Today's weight: 134 lb Today's date: 12/15/2024 Total lbs lost to date: 34 lb Total lbs lost since last in-office visit: -5 lbs Total weight loss percentage to date: -20.24% Body mass index is 24.95 kg/m.   Assessment & Plan:   Laure was seen today for medical management of chronic issues.  Diagnoses and all orders for this visit:  Essential hypertension The current medical regimen is effective;  continue present plan and medications.  Attention deficit hyperactivity disorder (ADHD), controlled with Vyvanse  The current medical regimen is effective;  continue present plan and medications. -     Lisdexamfetamine  Dimesylate (VYVANSE ) 60 MG CHEW; Chew 1 tablet (60 mg total) by mouth every morning.  Prediabetes The current medical regimen is effective;  continue present plan and medications.  Mixed hyperlipidemia, with cardiac CT score 0 on 03/31/23 The current medical regimen is effective;  continue present plan and medications.  Depression, recurrent, stable on Zoloft  The current medical regimen is effective;  continue present plan and medications.  Normal weight with body mass index (BMI) of 24.0 to 24.9 in adult The current medical regimen is effective;  continue present plan and medications.  Labs reviewed from 06/08/2024: Lipid panel: Total cholesterol mildly elevated at 218 mg/dL with LDL 872 mg/dL and non-HDL 849 mg/dL. HDL favorable at 68 mg/dL and triglycerides normal (129 mg/dL). Overall consistent with borderline hyperlipidemia with protective HDL profile. Glycemic control: Hemoglobin A1c 5.3% (eAG 105), within normal range. No evidence of prediabetes or diabetes. Comprehensive metabolic panel: Fasting glucose normal. Creatinine 1.15 with eGFR 55 mL/min, consistent with CKD stage 3a; noted as improving. Electrolytes  largely normal with potassium at upper limit of normal (5.5). Liver enzymes and bilirubin within normal limits. CBC with differential: Hemoglobin and indices normal. Platelets mildly elevated at 443 K/uL with low MPV; remainder of CBC and differential unremarkable.  Frieze Prentiss, DO, MS, FAAFP, Dipl. [facility] at [facility] [address] [place], [address] Dept: [phone] Dept Fax: [phone]  Subjective:   History of Present Illness Toni Hamilton is a 59 year old female who presents for a depression screening.  Mood and affect - Increased caregiving responsibilities for her sister-in-law since a hospitalization approximately one month ago - Disruption of daily routines due to caregiving duties - Uncertainty about returning to work due to financial concerns and husband's job instability - Considering volunteer work to maintain sense of purpose and social connection  Functional status and coping mechanisms - Remains socially and physically active with weekly games, art activities, and yoga - Attempting to reestablish a regular routine despite recent disruptions  Medication access and financial stress - Concern regarding recent doubling in cost of Vyvanse  to $110 per month  Review of Systems: Negative, with the exception of above mentioned in HPI.  History:   Reviewed by clinician on day of visit: allergies, medications, problem list, medical history, surgical history, family history, social history, and previous encounter notes.  Medications:   Show/hide medication list[1] Allergies[2]  Objective:   BP 112/60 (BP Location: Right Arm, Cuff Size: Normal)   Pulse 60   Ht 5' 1.5 (1.562 m)   Wt 134 lb 3.2 oz (60.9 kg)   SpO2 95%   BMI 24.95 kg/m   Physical Exam Constitutional:      General: She is not in acute  distress.    Appearance: She is well-developed.  HENT:     Head: Normocephalic and atraumatic.  Eyes:      Conjunctiva/sclera: Conjunctivae normal.  Cardiovascular:     Rate and Rhythm: Normal rate and regular rhythm.     Heart sounds: Normal heart sounds.  Pulmonary:     Effort: Pulmonary effort is normal.     Breath sounds: Normal breath sounds.  Neurological:     General: No focal deficit present.     Mental Status: She is alert.  Psychiatric:        Behavior: Behavior normal.    Attestations:   Reviewed by clinician on day of visit: allergies, medications, problem list, medical history, surgical history, family history, social history, and previous encounter notes. Discussed the use of AI scribe software for clinical note transcription with the patient, who gave verbal consent to proceed. As the patient's primary care physician, board-certified in Family Medicine and Obesity Medicine, I am providing ongoing, comprehensive obesity care based on the pillars of obesity medicine, including nutrition therapy, physical activity, behavioral modification, and pharmacologic treatment.  Geni Shutter, DO, MS, FAAFP, Dipl. [facility] at [facility] [address] [place], [address] Dept: [phone] Dept Fax: [phone]      [1]  Outpatient Medications Prior to Visit  Medication Sig   acetaminophen (TYLENOL) 650 MG CR tablet Take 1,300 mg by mouth daily as needed for pain.   amLODipine (NORVASC) 5 MG tablet Take 5 mg by mouth daily.   buPROPion  (WELLBUTRIN  XL) 150 MG 24 hr tablet Take 1 tablet (150 mg total) by mouth in the morning.   lisinopril (PRINIVIL,ZESTRIL) 10 MG tablet Take 10 mg by mouth daily.   OVER THE COUNTER MEDICATION Vitamin D2 1.25 mg one tablet weekly   Semaglutide -Weight Management (WEGOVY ) 2.4 MG/0.75ML SOAJ Inject 2.4 mg into the skin once a week.   sertraline  (ZOLOFT ) 25 MG tablet Take 1 tablet (25 mg total) by mouth daily.   traZODone (DESYREL) 50 MG tablet Take 50-100 mg by mouth at bedtime.   [DISCONTINUED] lisdexamfetamine   (VYVANSE ) 30 MG capsule Take 1 capsule (30 mg total) by mouth daily in the afternoon.   [DISCONTINUED] lisdexamfetamine  (VYVANSE ) 40 MG capsule Take 1 capsule (40 mg total) by mouth in the morning.   No facility-administered medications prior to visit.  [2] No Known Allergies  "

## 2025-01-06 ENCOUNTER — Telehealth: Payer: Self-pay

## 2025-01-06 ENCOUNTER — Other Ambulatory Visit (HOSPITAL_COMMUNITY): Payer: Self-pay

## 2025-01-06 NOTE — Telephone Encounter (Signed)
 Pharmacy Patient Advocate Encounter   Received notification from [employer] that prior authorization for Lisdexamfetamine  Dimesylate 60MG  chewable tablets  is required/requested.   Insurance verification completed.   The patient is insured through [employer] [facility].   Per test claim: PA required; PA submitted to above mentioned insurance via Latent Key/confirmation #/EOC [id number] Status is pending

## 2025-01-25 ENCOUNTER — Ambulatory Visit: Admitting: Family Medicine
